# Patient Record
Sex: Male | Born: 1950 | Race: White | Hispanic: No | Marital: Married | Smoking: Former smoker
Health system: Southern US, Community
[De-identification: ages and names within clinical notes are randomized; demographics above are authoritative.]

## PROBLEM LIST (undated history)

## (undated) ENCOUNTER — Emergency Department (HOSPITAL_COMMUNITY): Discharge status: Absent without leave | Payer: Medicare Other

## (undated) DIAGNOSIS — J449 Chronic obstructive pulmonary disease, unspecified: Secondary | ICD-10-CM

## (undated) DIAGNOSIS — M1990 Unspecified osteoarthritis, unspecified site: Secondary | ICD-10-CM

## (undated) DIAGNOSIS — Z8673 Personal history of transient ischemic attack (TIA), and cerebral infarction without residual deficits: Secondary | ICD-10-CM

## (undated) DIAGNOSIS — C4490 Unspecified malignant neoplasm of skin, unspecified: Secondary | ICD-10-CM

## (undated) HISTORY — DX: Personal history of transient ischemic attack (TIA), and cerebral infarction without residual deficits: Z86.73

## (undated) HISTORY — DX: Chronic obstructive pulmonary disease, unspecified: J44.9

## (undated) HISTORY — DX: Unspecified osteoarthritis, unspecified site: M19.90

## (undated) HISTORY — DX: Unspecified malignant neoplasm of skin, unspecified: C44.90

## (undated) HISTORY — PX: KNEE SURGERY: SHX244

---

## 2014-10-05 DIAGNOSIS — R972 Elevated prostate specific antigen [PSA]: Secondary | ICD-10-CM | POA: Diagnosis not present

## 2014-10-05 DIAGNOSIS — N401 Enlarged prostate with lower urinary tract symptoms: Secondary | ICD-10-CM | POA: Diagnosis not present

## 2014-10-05 DIAGNOSIS — R361 Hematospermia: Secondary | ICD-10-CM | POA: Diagnosis not present

## 2014-10-10 DIAGNOSIS — R319 Hematuria, unspecified: Secondary | ICD-10-CM | POA: Diagnosis not present

## 2014-10-10 DIAGNOSIS — R361 Hematospermia: Secondary | ICD-10-CM | POA: Diagnosis not present

## 2014-10-10 DIAGNOSIS — N3090 Cystitis, unspecified without hematuria: Secondary | ICD-10-CM | POA: Diagnosis not present

## 2014-10-10 DIAGNOSIS — N400 Enlarged prostate without lower urinary tract symptoms: Secondary | ICD-10-CM | POA: Diagnosis not present

## 2014-10-10 DIAGNOSIS — N281 Cyst of kidney, acquired: Secondary | ICD-10-CM | POA: Diagnosis not present

## 2014-10-10 DIAGNOSIS — K7689 Other specified diseases of liver: Secondary | ICD-10-CM | POA: Diagnosis not present

## 2014-10-24 DIAGNOSIS — R361 Hematospermia: Secondary | ICD-10-CM | POA: Diagnosis not present

## 2014-10-24 DIAGNOSIS — R972 Elevated prostate specific antigen [PSA]: Secondary | ICD-10-CM | POA: Diagnosis not present

## 2014-11-14 DIAGNOSIS — Z85828 Personal history of other malignant neoplasm of skin: Secondary | ICD-10-CM | POA: Diagnosis not present

## 2014-11-14 DIAGNOSIS — M25561 Pain in right knee: Secondary | ICD-10-CM | POA: Diagnosis not present

## 2014-11-14 DIAGNOSIS — N63 Unspecified lump in breast: Secondary | ICD-10-CM | POA: Diagnosis not present

## 2014-11-15 DIAGNOSIS — M25561 Pain in right knee: Secondary | ICD-10-CM | POA: Diagnosis not present

## 2014-11-20 DIAGNOSIS — N411 Chronic prostatitis: Secondary | ICD-10-CM | POA: Diagnosis not present

## 2014-11-20 DIAGNOSIS — R972 Elevated prostate specific antigen [PSA]: Secondary | ICD-10-CM | POA: Diagnosis not present

## 2014-11-20 DIAGNOSIS — N401 Enlarged prostate with lower urinary tract symptoms: Secondary | ICD-10-CM | POA: Diagnosis not present

## 2014-11-21 DIAGNOSIS — M23241 Derangement of anterior horn of lateral meniscus due to old tear or injury, right knee: Secondary | ICD-10-CM | POA: Diagnosis not present

## 2014-11-21 DIAGNOSIS — S83241A Other tear of medial meniscus, current injury, right knee, initial encounter: Secondary | ICD-10-CM | POA: Diagnosis not present

## 2014-11-21 DIAGNOSIS — M25561 Pain in right knee: Secondary | ICD-10-CM | POA: Diagnosis not present

## 2014-11-21 DIAGNOSIS — M179 Osteoarthritis of knee, unspecified: Secondary | ICD-10-CM | POA: Diagnosis not present

## 2014-11-22 DIAGNOSIS — S83281A Other tear of lateral meniscus, current injury, right knee, initial encounter: Secondary | ICD-10-CM | POA: Diagnosis not present

## 2014-11-22 DIAGNOSIS — M179 Osteoarthritis of knee, unspecified: Secondary | ICD-10-CM | POA: Diagnosis not present

## 2014-11-29 DIAGNOSIS — N63 Unspecified lump in breast: Secondary | ICD-10-CM | POA: Diagnosis not present

## 2014-11-29 DIAGNOSIS — D171 Benign lipomatous neoplasm of skin and subcutaneous tissue of trunk: Secondary | ICD-10-CM | POA: Diagnosis not present

## 2014-11-29 DIAGNOSIS — R972 Elevated prostate specific antigen [PSA]: Secondary | ICD-10-CM | POA: Diagnosis not present

## 2014-11-29 DIAGNOSIS — D1779 Benign lipomatous neoplasm of other sites: Secondary | ICD-10-CM | POA: Diagnosis not present

## 2014-11-29 DIAGNOSIS — N401 Enlarged prostate with lower urinary tract symptoms: Secondary | ICD-10-CM | POA: Diagnosis not present

## 2014-12-11 DIAGNOSIS — M549 Dorsalgia, unspecified: Secondary | ICD-10-CM | POA: Diagnosis not present

## 2014-12-11 DIAGNOSIS — G894 Chronic pain syndrome: Secondary | ICD-10-CM | POA: Diagnosis not present

## 2014-12-11 DIAGNOSIS — M545 Low back pain: Secondary | ICD-10-CM | POA: Diagnosis not present

## 2014-12-11 DIAGNOSIS — G890 Central pain syndrome: Secondary | ICD-10-CM | POA: Diagnosis not present

## 2014-12-11 DIAGNOSIS — M79605 Pain in left leg: Secondary | ICD-10-CM | POA: Diagnosis not present

## 2014-12-11 DIAGNOSIS — G8929 Other chronic pain: Secondary | ICD-10-CM | POA: Diagnosis not present

## 2014-12-11 DIAGNOSIS — M79604 Pain in right leg: Secondary | ICD-10-CM | POA: Diagnosis not present

## 2014-12-11 DIAGNOSIS — M159 Polyosteoarthritis, unspecified: Secondary | ICD-10-CM | POA: Diagnosis not present

## 2014-12-11 DIAGNOSIS — M542 Cervicalgia: Secondary | ICD-10-CM | POA: Diagnosis not present

## 2014-12-11 DIAGNOSIS — M79602 Pain in left arm: Secondary | ICD-10-CM | POA: Diagnosis not present

## 2014-12-11 DIAGNOSIS — M79601 Pain in right arm: Secondary | ICD-10-CM | POA: Diagnosis not present

## 2014-12-11 DIAGNOSIS — M797 Fibromyalgia: Secondary | ICD-10-CM | POA: Diagnosis not present

## 2014-12-18 DIAGNOSIS — N3090 Cystitis, unspecified without hematuria: Secondary | ICD-10-CM | POA: Diagnosis not present

## 2014-12-24 DIAGNOSIS — N3090 Cystitis, unspecified without hematuria: Secondary | ICD-10-CM | POA: Diagnosis not present

## 2014-12-26 DIAGNOSIS — S83281A Other tear of lateral meniscus, current injury, right knee, initial encounter: Secondary | ICD-10-CM | POA: Diagnosis not present

## 2015-01-01 DIAGNOSIS — M25569 Pain in unspecified knee: Secondary | ICD-10-CM | POA: Diagnosis not present

## 2015-01-01 DIAGNOSIS — E782 Mixed hyperlipidemia: Secondary | ICD-10-CM | POA: Diagnosis not present

## 2015-01-01 DIAGNOSIS — J449 Chronic obstructive pulmonary disease, unspecified: Secondary | ICD-10-CM | POA: Diagnosis not present

## 2015-01-01 DIAGNOSIS — I10 Essential (primary) hypertension: Secondary | ICD-10-CM | POA: Diagnosis not present

## 2015-01-07 DIAGNOSIS — Z0389 Encounter for observation for other suspected diseases and conditions ruled out: Secondary | ICD-10-CM | POA: Diagnosis not present

## 2015-01-07 DIAGNOSIS — Z01810 Encounter for preprocedural cardiovascular examination: Secondary | ICD-10-CM | POA: Diagnosis not present

## 2015-01-08 DIAGNOSIS — M23331 Other meniscus derangements, other medial meniscus, right knee: Secondary | ICD-10-CM | POA: Diagnosis not present

## 2015-01-08 DIAGNOSIS — G4733 Obstructive sleep apnea (adult) (pediatric): Secondary | ICD-10-CM | POA: Diagnosis not present

## 2015-01-08 DIAGNOSIS — E669 Obesity, unspecified: Secondary | ICD-10-CM | POA: Diagnosis not present

## 2015-01-08 DIAGNOSIS — S83241A Other tear of medial meniscus, current injury, right knee, initial encounter: Secondary | ICD-10-CM | POA: Diagnosis not present

## 2015-01-08 DIAGNOSIS — S83281A Other tear of lateral meniscus, current injury, right knee, initial encounter: Secondary | ICD-10-CM | POA: Diagnosis not present

## 2015-01-08 DIAGNOSIS — M23341 Other meniscus derangements, anterior horn of lateral meniscus, right knee: Secondary | ICD-10-CM | POA: Diagnosis not present

## 2015-01-08 DIAGNOSIS — M1711 Unilateral primary osteoarthritis, right knee: Secondary | ICD-10-CM | POA: Diagnosis not present

## 2015-01-09 DIAGNOSIS — R0602 Shortness of breath: Secondary | ICD-10-CM | POA: Diagnosis not present

## 2015-01-09 DIAGNOSIS — G4733 Obstructive sleep apnea (adult) (pediatric): Secondary | ICD-10-CM | POA: Diagnosis not present

## 2015-01-09 DIAGNOSIS — K219 Gastro-esophageal reflux disease without esophagitis: Secondary | ICD-10-CM | POA: Diagnosis not present

## 2015-01-09 DIAGNOSIS — R079 Chest pain, unspecified: Secondary | ICD-10-CM | POA: Diagnosis not present

## 2015-01-09 DIAGNOSIS — I10 Essential (primary) hypertension: Secondary | ICD-10-CM | POA: Diagnosis not present

## 2015-01-09 DIAGNOSIS — Z8546 Personal history of malignant neoplasm of prostate: Secondary | ICD-10-CM | POA: Diagnosis not present

## 2015-01-09 DIAGNOSIS — I498 Other specified cardiac arrhythmias: Secondary | ICD-10-CM | POA: Diagnosis not present

## 2015-01-09 DIAGNOSIS — I69398 Other sequelae of cerebral infarction: Secondary | ICD-10-CM | POA: Diagnosis not present

## 2015-01-09 DIAGNOSIS — M1990 Unspecified osteoarthritis, unspecified site: Secondary | ICD-10-CM | POA: Diagnosis not present

## 2015-01-09 DIAGNOSIS — Z87891 Personal history of nicotine dependence: Secondary | ICD-10-CM | POA: Diagnosis not present

## 2015-01-09 DIAGNOSIS — R0902 Hypoxemia: Secondary | ICD-10-CM | POA: Diagnosis not present

## 2015-01-09 DIAGNOSIS — I509 Heart failure, unspecified: Secondary | ICD-10-CM | POA: Diagnosis not present

## 2015-01-09 DIAGNOSIS — E119 Type 2 diabetes mellitus without complications: Secondary | ICD-10-CM | POA: Diagnosis not present

## 2015-01-09 DIAGNOSIS — J9811 Atelectasis: Secondary | ICD-10-CM | POA: Diagnosis not present

## 2015-01-09 DIAGNOSIS — Z79899 Other long term (current) drug therapy: Secondary | ICD-10-CM | POA: Diagnosis not present

## 2015-01-09 DIAGNOSIS — J811 Chronic pulmonary edema: Secondary | ICD-10-CM | POA: Diagnosis not present

## 2015-01-09 DIAGNOSIS — J441 Chronic obstructive pulmonary disease with (acute) exacerbation: Secondary | ICD-10-CM | POA: Diagnosis not present

## 2015-01-09 DIAGNOSIS — I249 Acute ischemic heart disease, unspecified: Secondary | ICD-10-CM | POA: Diagnosis not present

## 2015-01-09 DIAGNOSIS — Z9889 Other specified postprocedural states: Secondary | ICD-10-CM | POA: Diagnosis not present

## 2015-01-14 DIAGNOSIS — M25561 Pain in right knee: Secondary | ICD-10-CM | POA: Diagnosis not present

## 2015-01-14 DIAGNOSIS — R262 Difficulty in walking, not elsewhere classified: Secondary | ICD-10-CM | POA: Diagnosis not present

## 2015-01-14 DIAGNOSIS — S83281D Other tear of lateral meniscus, current injury, right knee, subsequent encounter: Secondary | ICD-10-CM | POA: Diagnosis not present

## 2015-01-15 DIAGNOSIS — Z6835 Body mass index (BMI) 35.0-35.9, adult: Secondary | ICD-10-CM | POA: Diagnosis not present

## 2015-01-15 DIAGNOSIS — R0989 Other specified symptoms and signs involving the circulatory and respiratory systems: Secondary | ICD-10-CM | POA: Diagnosis not present

## 2015-01-15 DIAGNOSIS — Z9889 Other specified postprocedural states: Secondary | ICD-10-CM | POA: Diagnosis not present

## 2015-01-31 DIAGNOSIS — J439 Emphysema, unspecified: Secondary | ICD-10-CM | POA: Diagnosis not present

## 2015-02-27 DIAGNOSIS — G890 Central pain syndrome: Secondary | ICD-10-CM | POA: Diagnosis not present

## 2015-02-27 DIAGNOSIS — G894 Chronic pain syndrome: Secondary | ICD-10-CM | POA: Diagnosis not present

## 2015-02-27 DIAGNOSIS — M159 Polyosteoarthritis, unspecified: Secondary | ICD-10-CM | POA: Diagnosis not present

## 2015-02-27 DIAGNOSIS — M1990 Unspecified osteoarthritis, unspecified site: Secondary | ICD-10-CM | POA: Diagnosis not present

## 2015-02-27 DIAGNOSIS — M797 Fibromyalgia: Secondary | ICD-10-CM | POA: Diagnosis not present

## 2015-03-03 DIAGNOSIS — J439 Emphysema, unspecified: Secondary | ICD-10-CM | POA: Diagnosis not present

## 2015-03-26 DIAGNOSIS — G4733 Obstructive sleep apnea (adult) (pediatric): Secondary | ICD-10-CM | POA: Diagnosis not present

## 2015-03-26 DIAGNOSIS — G44229 Chronic tension-type headache, not intractable: Secondary | ICD-10-CM | POA: Diagnosis not present

## 2015-03-26 DIAGNOSIS — G2581 Restless legs syndrome: Secondary | ICD-10-CM | POA: Diagnosis not present

## 2015-03-26 DIAGNOSIS — G250 Essential tremor: Secondary | ICD-10-CM | POA: Diagnosis not present

## 2015-04-02 DIAGNOSIS — J439 Emphysema, unspecified: Secondary | ICD-10-CM | POA: Diagnosis not present

## 2015-04-09 DIAGNOSIS — Z1389 Encounter for screening for other disorder: Secondary | ICD-10-CM | POA: Diagnosis not present

## 2015-04-09 DIAGNOSIS — Z0000 Encounter for general adult medical examination without abnormal findings: Secondary | ICD-10-CM | POA: Diagnosis not present

## 2015-04-09 DIAGNOSIS — Z139 Encounter for screening, unspecified: Secondary | ICD-10-CM | POA: Diagnosis not present

## 2015-04-18 DIAGNOSIS — J441 Chronic obstructive pulmonary disease with (acute) exacerbation: Secondary | ICD-10-CM | POA: Diagnosis not present

## 2015-04-18 DIAGNOSIS — Z6835 Body mass index (BMI) 35.0-35.9, adult: Secondary | ICD-10-CM | POA: Diagnosis not present

## 2015-05-09 DIAGNOSIS — E782 Mixed hyperlipidemia: Secondary | ICD-10-CM | POA: Diagnosis not present

## 2015-05-09 DIAGNOSIS — R7302 Impaired glucose tolerance (oral): Secondary | ICD-10-CM | POA: Diagnosis not present

## 2015-05-09 DIAGNOSIS — I10 Essential (primary) hypertension: Secondary | ICD-10-CM | POA: Diagnosis not present

## 2015-05-16 DIAGNOSIS — R7309 Other abnormal glucose: Secondary | ICD-10-CM | POA: Diagnosis not present

## 2015-05-16 DIAGNOSIS — I129 Hypertensive chronic kidney disease with stage 1 through stage 4 chronic kidney disease, or unspecified chronic kidney disease: Secondary | ICD-10-CM | POA: Diagnosis not present

## 2015-05-16 DIAGNOSIS — E782 Mixed hyperlipidemia: Secondary | ICD-10-CM | POA: Diagnosis not present

## 2015-05-16 DIAGNOSIS — N182 Chronic kidney disease, stage 2 (mild): Secondary | ICD-10-CM | POA: Diagnosis not present

## 2015-05-21 DIAGNOSIS — R635 Abnormal weight gain: Secondary | ICD-10-CM | POA: Diagnosis not present

## 2015-05-21 DIAGNOSIS — R609 Edema, unspecified: Secondary | ICD-10-CM | POA: Diagnosis not present

## 2015-05-21 DIAGNOSIS — R0602 Shortness of breath: Secondary | ICD-10-CM | POA: Diagnosis not present

## 2015-05-21 DIAGNOSIS — R05 Cough: Secondary | ICD-10-CM | POA: Diagnosis not present

## 2015-05-23 DIAGNOSIS — J449 Chronic obstructive pulmonary disease, unspecified: Secondary | ICD-10-CM | POA: Diagnosis not present

## 2015-06-11 DIAGNOSIS — Z6835 Body mass index (BMI) 35.0-35.9, adult: Secondary | ICD-10-CM | POA: Diagnosis not present

## 2015-06-11 DIAGNOSIS — J439 Emphysema, unspecified: Secondary | ICD-10-CM | POA: Diagnosis not present

## 2015-06-11 DIAGNOSIS — R739 Hyperglycemia, unspecified: Secondary | ICD-10-CM | POA: Diagnosis not present

## 2015-06-11 DIAGNOSIS — R05 Cough: Secondary | ICD-10-CM | POA: Diagnosis not present

## 2015-06-11 DIAGNOSIS — R0602 Shortness of breath: Secondary | ICD-10-CM | POA: Diagnosis not present

## 2015-06-12 DIAGNOSIS — M1990 Unspecified osteoarthritis, unspecified site: Secondary | ICD-10-CM | POA: Diagnosis not present

## 2015-06-12 DIAGNOSIS — J439 Emphysema, unspecified: Secondary | ICD-10-CM | POA: Diagnosis not present

## 2015-06-12 DIAGNOSIS — G894 Chronic pain syndrome: Secondary | ICD-10-CM | POA: Diagnosis not present

## 2015-06-12 DIAGNOSIS — F4542 Pain disorder with related psychological factors: Secondary | ICD-10-CM | POA: Diagnosis not present

## 2015-06-12 DIAGNOSIS — G609 Hereditary and idiopathic neuropathy, unspecified: Secondary | ICD-10-CM | POA: Diagnosis not present

## 2015-06-12 DIAGNOSIS — G890 Central pain syndrome: Secondary | ICD-10-CM | POA: Diagnosis not present

## 2015-06-12 DIAGNOSIS — M797 Fibromyalgia: Secondary | ICD-10-CM | POA: Diagnosis not present

## 2015-06-17 DIAGNOSIS — I361 Nonrheumatic tricuspid (valve) insufficiency: Secondary | ICD-10-CM | POA: Diagnosis not present

## 2015-06-17 DIAGNOSIS — R0602 Shortness of breath: Secondary | ICD-10-CM | POA: Diagnosis not present

## 2015-06-17 DIAGNOSIS — R609 Edema, unspecified: Secondary | ICD-10-CM | POA: Diagnosis not present

## 2015-06-25 DIAGNOSIS — R0601 Orthopnea: Secondary | ICD-10-CM | POA: Diagnosis not present

## 2015-06-25 DIAGNOSIS — R05 Cough: Secondary | ICD-10-CM | POA: Diagnosis not present

## 2015-06-25 DIAGNOSIS — Z68.41 Body mass index (BMI) pediatric, 5th percentile to less than 85th percentile for age: Secondary | ICD-10-CM | POA: Diagnosis not present

## 2015-06-25 DIAGNOSIS — I10 Essential (primary) hypertension: Secondary | ICD-10-CM | POA: Diagnosis not present

## 2015-07-12 DIAGNOSIS — J439 Emphysema, unspecified: Secondary | ICD-10-CM | POA: Diagnosis not present

## 2015-07-23 DIAGNOSIS — I10 Essential (primary) hypertension: Secondary | ICD-10-CM | POA: Diagnosis not present

## 2015-07-23 DIAGNOSIS — E1159 Type 2 diabetes mellitus with other circulatory complications: Secondary | ICD-10-CM | POA: Diagnosis not present

## 2015-07-23 DIAGNOSIS — J42 Unspecified chronic bronchitis: Secondary | ICD-10-CM | POA: Diagnosis not present

## 2015-07-23 DIAGNOSIS — J441 Chronic obstructive pulmonary disease with (acute) exacerbation: Secondary | ICD-10-CM | POA: Diagnosis not present

## 2015-07-30 DIAGNOSIS — G4733 Obstructive sleep apnea (adult) (pediatric): Secondary | ICD-10-CM | POA: Diagnosis not present

## 2015-07-30 DIAGNOSIS — I10 Essential (primary) hypertension: Secondary | ICD-10-CM | POA: Diagnosis not present

## 2015-07-30 DIAGNOSIS — R0600 Dyspnea, unspecified: Secondary | ICD-10-CM | POA: Diagnosis not present

## 2015-07-30 DIAGNOSIS — E785 Hyperlipidemia, unspecified: Secondary | ICD-10-CM | POA: Diagnosis not present

## 2015-07-30 DIAGNOSIS — R0602 Shortness of breath: Secondary | ICD-10-CM | POA: Diagnosis not present

## 2015-07-31 DIAGNOSIS — I739 Peripheral vascular disease, unspecified: Secondary | ICD-10-CM | POA: Diagnosis not present

## 2015-08-09 ENCOUNTER — Other Ambulatory Visit: Payer: Self-pay | Admitting: *Deleted

## 2015-08-09 ENCOUNTER — Encounter: Payer: Self-pay | Admitting: *Deleted

## 2015-08-09 ENCOUNTER — Ambulatory Visit (INDEPENDENT_AMBULATORY_CARE_PROVIDER_SITE_OTHER)
Admission: RE | Admit: 2015-08-09 | Discharge: 2015-08-09 | Disposition: A | Discharge status: Home / Self Care | Payer: Medicare Other | Source: Ambulatory Visit | Attending: Internal Medicine | Admitting: Internal Medicine

## 2015-08-09 ENCOUNTER — Other Ambulatory Visit (INDEPENDENT_AMBULATORY_CARE_PROVIDER_SITE_OTHER): Payer: Medicare Other

## 2015-08-09 ENCOUNTER — Ambulatory Visit (INDEPENDENT_AMBULATORY_CARE_PROVIDER_SITE_OTHER): Payer: Medicare Other | Admitting: Internal Medicine

## 2015-08-09 VITALS — BP 132/72 | HR 83 | Ht 74.0 in | Wt 268.6 lb

## 2015-08-09 DIAGNOSIS — R058 Other specified cough: Secondary | ICD-10-CM

## 2015-08-09 DIAGNOSIS — R059 Cough, unspecified: Secondary | ICD-10-CM

## 2015-08-09 DIAGNOSIS — R0600 Dyspnea, unspecified: Secondary | ICD-10-CM

## 2015-08-09 DIAGNOSIS — R0602 Shortness of breath: Secondary | ICD-10-CM | POA: Diagnosis not present

## 2015-08-09 DIAGNOSIS — I2699 Other pulmonary embolism without acute cor pulmonale: Secondary | ICD-10-CM | POA: Diagnosis not present

## 2015-08-09 DIAGNOSIS — J449 Chronic obstructive pulmonary disease, unspecified: Secondary | ICD-10-CM

## 2015-08-09 DIAGNOSIS — G4734 Idiopathic sleep related nonobstructive alveolar hypoventilation: Secondary | ICD-10-CM

## 2015-08-09 DIAGNOSIS — R05 Cough: Secondary | ICD-10-CM | POA: Diagnosis not present

## 2015-08-09 LAB — BASIC METABOLIC PANEL
BUN: 21 mg/dL (ref 6–23)
CALCIUM: 9.8 mg/dL (ref 8.4–10.5)
CO2: 32 mEq/L (ref 19–32)
Chloride: 103 mEq/L (ref 96–112)
Creatinine, Ser: 1.33 mg/dL (ref 0.40–1.50)
GFR: 57.23 mL/min — AB (ref >60.00)
GLUCOSE: 122 mg/dL — AB (ref 70–99)
POTASSIUM: 3.7 meq/L (ref 3.5–5.1)
SODIUM: 141 meq/L (ref 135–145)

## 2015-08-09 LAB — TSH: TSH: 1.1 u[IU]/mL (ref 0.35–4.50)

## 2015-08-09 LAB — CBC WITH DIFFERENTIAL/PLATELET
Basophils Absolute: 0 K/uL (ref 0.0–0.1)
Basophils Relative: 0.4 % (ref 0.0–3.0)
Eosinophils Absolute: 0.5 K/uL (ref 0.0–0.7)
Eosinophils Relative: 6.9 % — ABNORMAL HIGH (ref 0.0–5.0)
HCT: 40.2 % (ref 39.0–52.0)
Hemoglobin: 13.4 g/dL (ref 13.0–17.0)
Lymphocytes Relative: 25 % (ref 12.0–46.0)
Lymphs Abs: 1.8 K/uL (ref 0.7–4.0)
MCHC: 33.3 g/dL (ref 30.0–36.0)
MCV: 87.7 fl (ref 78.0–100.0)
Monocytes Absolute: 0.4 K/uL (ref 0.1–1.0)
Monocytes Relative: 6 % (ref 3.0–12.0)
Neutro Abs: 4.4 K/uL (ref 1.4–7.7)
Neutrophils Relative %: 61.7 % (ref 43.0–77.0)
Platelets: 241 K/uL (ref 150.0–400.0)
RBC: 4.59 Mil/uL (ref 4.22–5.81)
RDW: 13.1 % (ref 11.5–15.5)
WBC: 7.1 K/uL (ref 4.0–10.5)

## 2015-08-09 LAB — BRAIN NATRIURETIC PEPTIDE: PRO B NATRI PEPTIDE: 29 pg/mL (ref 0.0–100.0)

## 2015-08-09 MED ORDER — PREDNISONE 10 MG PO TABS: ORAL_TABLET | ORAL | Status: DC

## 2015-08-09 MED ORDER — FLUTTER DEVI
Status: DC
Start: 2015-08-09 — End: 2015-08-09

## 2015-08-09 MED ORDER — AMOXICILLIN-POT CLAVULANATE 875-125 MG PO TABS: 1.0000 | ORAL_TABLET | Freq: Two times a day (BID) | ORAL | Status: DC

## 2015-08-09 MED ORDER — FLUTTER DEVI: Status: DC

## 2015-08-09 NOTE — Patient Instructions (Addendum)
Continue dexilant Take 30-60 min before first meal of the day and zantac at bedtime   mucinex 1200 mg every 12 hours with flutter valve as much as can   GERD (REFLUX)  is an extremely common cause of respiratory symptoms just like yours , many times with no obvious heartburn at all.    It can be treated with medication, but also with lifestyle changes including elevation of the head of your bed (ideally with 6 inch  bed blocks),  Smoking cessation, avoidance of late meals, excessive alcohol, and avoid fatty foods, chocolate, peppermint, colas, red wine, and acidic juices such as orange juice.  NO MINT OR MENTHOL PRODUCTS SO NO COUGH DROPS  USE SUGARLESS CANDY INSTEAD (Jolley ranchers or Stover's or Life Savers) or even ice chips will also do - the key is to swallow to prevent all throat clearing. NO OIL BASED VITAMINS - use powdered substitutes.  stiolto 2 pffs each am   Augmentin 875 mg take one pill twice daily  X 10 days - take at breakfast and supper with large glass of water.  It would help reduce the usual side effects (diarrhea and yeast infections) if you ate cultured yogurt at lunch.   If not better >>> Prednisone 10 mg take  4 each am x 2 days,   2 each am x 2 days,  1 each am x 2 days and stop    Please remember to go to the lab and x-ray department downstairs for your tests - we will call you with the results when they are available.     Please schedule a follow up office visit in 2 weeks, sooner if needed  - spirometry at next ov plus allergy profile

## 2015-08-09 NOTE — Progress Notes (Signed)
Subjective:     Patient ID: Luis Bryan, male   DOB: June 17, 1951,     MRN: [id number]  HPI  20 yowm quit smoking 1980 started having breathing problems around 2013 at wt around 225 rx with various inhalers/ and freq prednisone then much worse x early summer 2016 and changed to breo and incruse late oct 2016 by Dr Sheral Apley who referred him to pulmonary clinic 08/09/2015    08/09/2015 1st [facility] office visit/ Rakan Soffer   Chief Complaint  Patient presents with  . Pulmonary Consult    Referred by Dr. Nyra Capes. Pt c/o constant SOB that is worse exertion and when laying down, occasional wet cough with green/brown mucus and some wheeze. Pt also has frequent episodes of dizziness.   last time could lie down was 6 m prior to OV  And sleeps semi - recumbent with severe noct wheeze and loss of voice  And hacking cough assoc with nasal congestion, all occurring over the same time frame. Just started 02 six months ago and does not have portable system but would like one as can't get across a parking lot s sob or even walk at grocery stores any more- some better p combivent using up to 4 x daily Has remote asbestos exp  No obvious  patterns in day to day or daytime variabilty or assoc chronic cough or cp or chest tightness, subjective wheeze overt sinus or hb symptoms. No unusual exp hx or h/o childhood pna/ asthma or knowledge of premature birth.  Also denies any obvious fluctuation of symptoms with weather or environmental changes or other aggravating or alleviating factors except as outlined above   Current Medications, Allergies, Complete Past Medical History, Past Surgical History, Family History, and Social History were reviewed in [employer] record.              Review of Systems  Constitutional: Negative for fever and unexpected weight change.  HENT: Positive for congestion, postnasal drip and rhinorrhea. Negative for dental problem, ear pain, nosebleeds,  sinus pressure, sneezing, sore throat and trouble swallowing.   Eyes: Negative.  Negative for redness and itching.  Respiratory: Positive for cough, shortness of breath and wheezing. Negative for chest tightness.   Cardiovascular: Positive for palpitations and leg swelling.  Gastrointestinal: Negative.  Negative for nausea and vomiting.  Endocrine: Negative.   Genitourinary: Negative.  Negative for dysuria.  Musculoskeletal: Negative.  Negative for joint swelling.  Skin: Negative.  Negative for rash.  Allergic/Immunologic: Negative.   Neurological: Positive for headaches.  Hematological: Bruises/bleeds easily.  Psychiatric/Behavioral: Negative.  Negative for dysphoric mood. The patient is not nervous/anxious.        Objective:   Physical Exam   amb obese wm nad at rest  Wt Readings from Last 3 Encounters:  08/09/15 268 lb 9.6 oz (121.836 kg)    Vital signs reviewed   HEENT: nl dentition, turbinates, and oropharynx. Nl external ear canals without cough reflex   NECK :  without JVD/Nodes/TM/ nl carotid upstrokes bilaterally   LUNGS: no acc muscle use, clear to A and P bilaterally without cough on insp or exp maneuvers   CV:  RRR  no s3 or murmur or increase in P2, no edema   ABD:  Massively obese but  soft and nontender with nl excursion in the supine position. No bruits or organomegaly, bowel sounds nl  MS:  warm without deformities, calf tenderness, cyanosis or clubbing  SKIN: warm and dry without lesions  NEURO:  alert, approp, no deficits    CXR PA and Lateral:   08/09/2015 :    I personally reviewed images and agree with radiology impression as follows:   No evidence of lobar pneumonia or edema, however, the chest x-ray is suggestive of hilar lymphadenopathy  Labs ordered/ reviewed:   Lab 08/09/15 1617  NA 141  K 3.7  CL 103  CO2 32  BUN 21  CREATININE 1.33  GLUCOSE 122*     Lab 08/09/15 1617  HGB 13.4  HCT 40.2  WBC 7.1  PLT 241.0  Eos 0.5     Lab Results  Component Value Date   TSH 1.10 08/09/2015     Lab Results  Component Value Date   PROBNP 29.0 08/09/2015            Assessment:

## 2015-08-10 DIAGNOSIS — J9611 Chronic respiratory failure with hypoxia: Secondary | ICD-10-CM | POA: Insufficient documentation

## 2015-08-10 DIAGNOSIS — R05 Cough: Secondary | ICD-10-CM | POA: Insufficient documentation

## 2015-08-10 DIAGNOSIS — J449 Chronic obstructive pulmonary disease, unspecified: Secondary | ICD-10-CM | POA: Insufficient documentation

## 2015-08-10 DIAGNOSIS — J9612 Chronic respiratory failure with hypercapnia: Secondary | ICD-10-CM

## 2015-08-10 DIAGNOSIS — R058 Other specified cough: Secondary | ICD-10-CM | POA: Insufficient documentation

## 2015-08-10 NOTE — Assessment & Plan Note (Signed)
rx per Dr Nyra Capes with noct 02 2lpm  - HC03  32 08/09/2015 c/w mild hypercarbia  - did not desat walking x 2 laps 08/09/2015 RA

## 2015-08-10 NOTE — Assessment & Plan Note (Signed)
Complicated by hbp/ hyperlipidemia, ? Incipient OHS (HC03 32 on 08/09/2015 )  Body mass index is 34.47 kg/(m^2).  Lab Results  Component Value Date   TSH 1.10 08/09/2015     Contributing also  to gerd tendency/ doe/reviewed the need and the process to achieve and maintain neg calorie balance > defer f/u primary care including intermittently monitoring thyroid status

## 2015-08-10 NOTE — Assessment & Plan Note (Signed)
Symptoms are markedly disproportionate to objective findings and not clear this is all lung problem but pt does appear to have difficult airway management issues.  DDX of  difficult airways management all start with A and  include Adherence, Ace Inhibitors, Acid Reflux, Active Sinus Disease, Alpha 1 Antitripsin deficiency, Anxiety masquerading as Airways dz,  ABPA,  allergy(esp in young), Aspiration (esp in elderly), Adverse effects of meds,  Active smokers, A bunch of PE's (a small clot burden can't cause this syndrome unless there is already severe underlying pulm or vascular dz with poor reserve) plus two Bs  = Bronchiectasis and Beta blocker use..and one C= CHF   Adherence is always the initial "prime suspect" and is a multilayered concern that requires a "trust but verify" approach in every patient - starting with knowing how to use medications, especially inhalers, correctly, keeping up with refills and understanding the fundamental difference between maintenance and prns vs those medications only taken for a very short course and then stopped and not refilled.  .-The proper method of use, as well as anticipated side effects, of a metered-dose inhaler are discussed and demonstrated to the patient. Improved effectiveness after extensive coaching during this visit to a level of approximately  90% so try stiolto x 2 weeks then regroup  ? Active sinus dz > Augmentin 875 mg take one pill twice daily  X 10 days - take at breakfast and supper with large glass of water.  It would help reduce the usual side effects (diarrhea and yeast infections) if you ate cultured yogurt at lunch.   ? Allergies/ ? Asthmatic component > Prednisone 10 mg take  4 each am x 2 days,   2 each am x 2 days,  1 each am x 2 days and stop    ? chf > excluded with bnp << 100 though could have mild cor pulmonale with prominent PA's on cxr   I had an extended discussion with the patient and wife  reviewing all relevant studies  completed to date and  lasting 35 minutes of a 60 minute visit    Each maintenance medication was reviewed in detail including most importantly the difference between maintenance and prns and under what circumstances the prns are to be triggered using an action plan format that is not reflected in the computer generated alphabetically organized AVS.    Please see instructions for details which were reviewed in writing and the patient given a copy highlighting the part that I personally wrote and discussed at today's ov.

## 2015-08-10 NOTE — Assessment & Plan Note (Signed)
08/09/2015   Walked RA  2 laps @ 185 ft each stopped due to  Cough and sob/ nl pace, no desat  I was more impressed that cough is stopping him more than ventilatory impairment so will address this first pending return for pfts

## 2015-08-10 NOTE — Assessment & Plan Note (Addendum)
The most common causes of chronic cough in immunocompetent adults include the following: upper airway cough syndrome (UACS), previously referred to as postnasal drip syndrome (PNDS), which is caused by variety of rhinosinus conditions; (2) asthma; (3) GERD; (4) chronic bronchitis from cigarette smoking or other inhaled environmental irritants; (5) nonasthmatic eosinophilic bronchitis; and (6) bronchiectasis.   These conditions, singly or in combination, have accounted for up to 94% of the causes of chronic cough in prospective studies.   Other conditions have constituted no >6% of the causes in prospective studies These have included bronchogenic carcinoma, chronic interstitial pneumonia, sarcoidosis, left ventricular failure, ACEI-induced cough, and aspiration from a condition associated with pharyngeal dysfunction.    Chronic cough is often simultaneously caused by more than one condition. A single cause has been found from 38 to 82% of the time, multiple causes from 18 to 62%. Multiply caused cough has been the result of three diseases up to 42% of the time.       Based on hx and exam, this is most likely:  Classic Upper airway cough syndrome, so named because it's frequently impossible to sort out how much is  CR/sinusitis with freq throat clearing (which can be related to primary GERD)   vs  causing  secondary (" extra esophageal")  GERD from wide swings in gastric pressure that occur with throat clearing, often  promoting self use of mint and menthol lozenges that reduce the lower esophageal sphincter tone and exacerbate the problem further in a cyclical fashion.   These are the same pts (now being labeled as having "irritable larynx syndrome" by some cough centers) who not infrequently have a history of having failed to tolerate ace inhibitors,  dry powder inhalers (like breo and incruse) or biphosphonates or report having atypical reflux symptoms that don't respond to standard doses of PPI , and  are easily confused as having aecopd or asthma flares by even experienced allergists/ pulmonologists.   The first step is to continue to maximize acid suppression and eliminate cyclical cough with flutter valve/ rx ?  sinus dz if present as well as stop  all dpi's then regroup in 2 weeks with sinus CT and full allergy profile next  if still coughing but this is not a typical CB pattern cough.

## 2015-08-12 DIAGNOSIS — J439 Emphysema, unspecified: Secondary | ICD-10-CM | POA: Diagnosis not present

## 2015-08-13 NOTE — Progress Notes (Signed)
Quick Note:  Contacted pt with results per MW Pt expressed understanding, nothing further needed ______ 

## 2015-09-02 ENCOUNTER — Other Ambulatory Visit: Payer: Medicare Other

## 2015-09-02 ENCOUNTER — Ambulatory Visit (INDEPENDENT_AMBULATORY_CARE_PROVIDER_SITE_OTHER): Payer: Medicare Other | Admitting: Internal Medicine

## 2015-09-02 ENCOUNTER — Encounter: Payer: Self-pay | Admitting: Internal Medicine

## 2015-09-02 VITALS — BP 122/72 | HR 79 | Ht 74.0 in | Wt 269.4 lb

## 2015-09-02 DIAGNOSIS — R058 Other specified cough: Secondary | ICD-10-CM

## 2015-09-02 DIAGNOSIS — J9611 Chronic respiratory failure with hypoxia: Secondary | ICD-10-CM

## 2015-09-02 DIAGNOSIS — J9612 Chronic respiratory failure with hypercapnia: Secondary | ICD-10-CM

## 2015-09-02 DIAGNOSIS — R05 Cough: Secondary | ICD-10-CM

## 2015-09-02 DIAGNOSIS — J449 Chronic obstructive pulmonary disease, unspecified: Secondary | ICD-10-CM

## 2015-09-02 DIAGNOSIS — R0600 Dyspnea, unspecified: Secondary | ICD-10-CM

## 2015-09-02 LAB — NITRIC OXIDE: Nitric Oxide: 88

## 2015-09-02 MED ORDER — TRAMADOL HCL 50 MG PO TABS: ORAL_TABLET | ORAL | Status: DC

## 2015-09-02 NOTE — Patient Instructions (Addendum)
Please see patient coordinator before you leave today  to schedule sinus and chest CT next and I will call you with results   Take delsym two tsp every 12 hours and supplement if needed with  tramadol 50 mg up to 2 every 4 hours to suppress the urge to cough. Swallowing water or using ice chips/non mint and menthol containing candies (such as lifesavers or sugarless jolly ranchers) are also effective.  You should rest your voice and avoid activities that you know make you cough.  Once you have eliminated the cough for 3 straight days try reducing the tramadol first,  then the delsym as tolerated.    Please schedule a follow up office visit in 2 weeks, sooner if needed

## 2015-09-02 NOTE — Progress Notes (Signed)
Subjective:     Patient ID: Luis Bryan, male   DOB: 1951-02-05,     MRN: [id number]    Brief patient profile:  61 yowm quit smoking 1980 started having breathing problems around 2013 at wt around 225 rx with various inhalers/ and freq prednisone then much worse x early summer 2016 and changed to breo and incruse late oct 2016 by Dr Sheral Apley who referred him to pulmonary clinic 08/09/2015  With ? Copd but had completel nl spirometry in office 09/02/2015 while symptomatic    History of Present Illness  08/09/2015 1st [facility] office visit/ [name]   Chief Complaint  Patient presents with  . Pulmonary Consult    Referred by Dr. Nyra Capes. Pt c/o constant SOB that is worse exertion and when laying down, occasional wet cough with green/brown mucus and some wheeze. Pt also has frequent episodes of dizziness.   last time could lie down was 6 m prior to OV  And sleeps semi - recumbent with severe noct wheeze and loss of voice  And hacking cough assoc with nasal congestion, all occurring over the same time frame. Just started 02 six months ago and does not have portable system but would like one as can't get across a parking lot s sob or even walk at grocery stores any more- some better p combivent using up to 4 x daily Has remote asbestos exp rec Continue dexilant Take 30-60 min before first meal of the day and zantac at bedtime  mucinex 1200 mg every 12 hours with flutter valve as much as can  GERD diet   stiolto 2 pffs each am  Augmentin 875 mg take one pill twice daily  X 10 days - take at breakfast and supper with large glass of water.  It would help reduce the usual side effects (diarrhea and yeast infections) if you ate cultured yogurt at lunch.  If not better >>> Prednisone 10 mg take  4 each am x 2 days,   2 each am x 2 days,  1 each am x 2 days and stop        09/02/2015  f/u ov/Gerlean Cid re: cough ? etiology Chief Complaint  Patient presents with  . Follow-up    Pt c/o continued  cough with brown mucus, wheeze and SOB. Pt states that he improved while on abx/pred but that once medications stopped his symptoms returned. Pt also states that flutter valve has not helped and "makes his chest hurt".   quite a bit better on abx / prednisone/ worse on flutter/ worse on stiolto so d/'d it.   No obvious day to day or daytime variability or assoc chest tightness, subjective wheeze or overt sinus or hb symptoms. No unusual exp hx or h/o childhood pna/ asthma or knowledge of premature birth.  Sleeping ok without nocturnal  or early am exacerbation  of respiratory  c/o's or need for noct saba. Also denies any obvious fluctuation of symptoms with weather or environmental changes or other aggravating or alleviating factors except as outlined above   Current Medications, Allergies, Complete Past Medical History, Past Surgical History, Family History, and Social History were reviewed in [employer] record.  ROS  The following are not active complaints unless bolded sore throat, dysphagia, dental problems, itching, sneezing,  nasal congestion or excess/ purulent secretions, ear ache,   fever, chills, sweats, unintended wt loss, classically pleuritic or exertional cp, hemoptysis,  orthopnea pnd or leg swelling, presyncope, palpitations, abdominal  pain, anorexia, nausea, vomiting, diarrhea  or change in bowel or bladder habits, change in stools or urine, dysuria,hematuria,  rash, arthralgias, visual complaints, headache, numbness, weakness or ataxia or problems with walking or coordination,  change in mood/affect or memory.                Objective:   Physical Exam   amb hoarse obese wm nad at rest/ pseudowheeze only / coughing fits noted esp with walking   Wt Readings from Last 3 Encounters:  09/02/15 269 lb 6.4 oz (122.199 kg)  08/09/15 268 lb 9.6 oz (121.836 kg)    Vital signs reviewed    HEENT: nl dentition, turbinates, and oropharynx. Nl external ear  canals without cough reflex   NECK :  without JVD/Nodes/TM/ nl carotid upstrokes bilaterally   LUNGS: no acc muscle use, clear to A and P bilaterally without cough on insp or exp maneuvers   CV:  RRR  no s3 or murmur or increase in P2, no edema   ABD:  Massively obese but  soft and nontender with nl excursion in the supine position. No bruits or organomegaly, bowel sounds nl  MS:  warm without deformities, calf tenderness, cyanosis or clubbing  SKIN: warm and dry without lesions    NEURO:  alert, approp, no deficits      Labs  reviewed:   Lab 08/09/15 1617  NA 141  K 3.7  CL 103  CO2 32  BUN 21  CREATININE 1.33  GLUCOSE 122*     Lab 08/09/15 1617  HGB 13.4  HCT 40.2  WBC 7.1  PLT 241.0  Eos 0.5    Lab Results  Component Value Date   TSH 1.10 08/09/2015     Lab Results  Component Value Date   PROBNP 29.0 08/09/2015      I personally reviewed images and agree with radiology impression as follows:  CXR:  08/09/15 No evidence of lobar pneumonia or edema, however, the chest x-ray is suggestive of hilar lymphadenopathy, and further evaluation with chest CT is recommended.        Assessment:

## 2015-09-03 DIAGNOSIS — G894 Chronic pain syndrome: Secondary | ICD-10-CM | POA: Diagnosis not present

## 2015-09-03 DIAGNOSIS — G890 Central pain syndrome: Secondary | ICD-10-CM | POA: Diagnosis not present

## 2015-09-03 DIAGNOSIS — F4542 Pain disorder with related psychological factors: Secondary | ICD-10-CM | POA: Diagnosis not present

## 2015-09-03 DIAGNOSIS — Z8673 Personal history of transient ischemic attack (TIA), and cerebral infarction without residual deficits: Secondary | ICD-10-CM | POA: Diagnosis not present

## 2015-09-03 DIAGNOSIS — M159 Polyosteoarthritis, unspecified: Secondary | ICD-10-CM | POA: Diagnosis not present

## 2015-09-03 DIAGNOSIS — G609 Hereditary and idiopathic neuropathy, unspecified: Secondary | ICD-10-CM | POA: Diagnosis not present

## 2015-09-03 DIAGNOSIS — M797 Fibromyalgia: Secondary | ICD-10-CM | POA: Diagnosis not present

## 2015-09-03 LAB — ALLERGY FULL PROFILE
Allergen, D pternoyssinus,d7: <0.1 kU/L
Allergen,Goose feathers, e70: <0.1 kU/L
Box Elder IgE: <0.1 kU/L
Candida Albicans: <0.1 kU/L
Common Ragweed: <0.1 kU/L
D. farinae: <0.1 kU/L
Dog Dander: <0.1 kU/L
G005 Rye, Perennial: <0.1 kU/L
Goldenrod: <0.1 kU/L
Helminthosporium halodes: <0.1 kU/L
House Dust Hollister: <0.1 kU/L
IgE (Immunoglobulin E), Serum: 26 kU/L (ref <115)
Lamb's Quarters: <0.1 kU/L
Stemphylium Botryosum: <0.1 kU/L
Sycamore Tree: <0.1 kU/L
Timothy Grass: <0.1 kU/L

## 2015-09-08 NOTE — Assessment & Plan Note (Addendum)
Allergy profile:  08/09/2015 Eos 0.5  - 09/02/15 >  IgE 26 with Neg RAST - Sinus CT next step  Strongly convinced given nl spirometry with active symptoms that this is  Classic Upper airway cough syndrome, so named because it's frequently impossible to sort out how much is  CR/sinusitis with freq throat clearing (which can be related to primary GERD)   vs  causing  secondary (" extra esophageal")  GERD from wide swings in gastric pressure that occur with throat clearing, often  promoting self use of mint and menthol lozenges that reduce the lower esophageal sphincter tone and exacerbate the problem further in a cyclical fashion.   These are the same pts (now being labeled as having "irritable larynx syndrome" by some cough centers) who not infrequently have a history of having failed to tolerate ace inhibitors,  dry powder inhalers or biphosphonates or report having atypical reflux symptoms that don't respond to standard doses of PPI , and are easily confused as having aecopd or asthma flares by even experienced allergists/ pulmonologists.  For now rx with max gerd/eliminate cyclical cough with tramadol  eval for sinus dz/ add 1st gen h1 if sinus CT neg Other possibility is eos bronchitis in ddx which also tends to be steroid resp short term and the problme is that inhalers tend to make this worse so hold off for now   I had an extended discussion with the patient reviewing all relevant studies completed to date and  lasting 15 to 20 minutes of a 25 minute visit    Each maintenance medication was reviewed in detail including most importantly the difference between maintenance and prns and under what circumstances the prns are to be triggered using an action plan format that is not reflected in the computer generated alphabetically organized AVS.    Please see instructions for details which were reviewed in writing and the patient given a copy highlighting the part that I personally wrote and discussed  at today's ov.

## 2015-09-08 NOTE — Assessment & Plan Note (Addendum)
08/09/2015  extensive coaching HFA effectiveness =    90% > try stiolto > no better so d/cd - spirometry 09/02/2015 no sign airflow obst so this is not copd and not even sure there is AB    Ok to use combivent prn

## 2015-09-08 NOTE — Assessment & Plan Note (Signed)
08/09/2015   Walked RA  2 laps @ 185 ft each stopped due to  Cough and sob/ nl pace, no desat  CTa chest next step looking for bronchiectasis and f/u the ? Adenopathy seen on previous cxr

## 2015-09-08 NOTE — Assessment & Plan Note (Signed)
rx per Dr Nyra Capes with noct 02 2lpm  - HC03  32 08/09/2015 c/w mild hypercarbia  - did not desat walking x 2 laps 08/09/2015 RA  Most likely obesity related/ no need for daytime 02 at this point

## 2015-09-10 ENCOUNTER — Other Ambulatory Visit: Payer: Self-pay | Admitting: Internal Medicine

## 2015-09-10 DIAGNOSIS — G250 Essential tremor: Secondary | ICD-10-CM | POA: Diagnosis not present

## 2015-09-10 DIAGNOSIS — J342 Deviated nasal septum: Secondary | ICD-10-CM | POA: Diagnosis not present

## 2015-09-10 DIAGNOSIS — I2699 Other pulmonary embolism without acute cor pulmonale: Secondary | ICD-10-CM | POA: Diagnosis not present

## 2015-09-10 DIAGNOSIS — I10 Essential (primary) hypertension: Secondary | ICD-10-CM | POA: Diagnosis not present

## 2015-09-10 DIAGNOSIS — J441 Chronic obstructive pulmonary disease with (acute) exacerbation: Secondary | ICD-10-CM | POA: Diagnosis not present

## 2015-09-10 DIAGNOSIS — G44229 Chronic tension-type headache, not intractable: Secondary | ICD-10-CM | POA: Diagnosis not present

## 2015-09-10 DIAGNOSIS — G4733 Obstructive sleep apnea (adult) (pediatric): Secondary | ICD-10-CM | POA: Diagnosis not present

## 2015-09-10 DIAGNOSIS — Z6834 Body mass index (BMI) 34.0-34.9, adult: Secondary | ICD-10-CM | POA: Diagnosis not present

## 2015-09-10 DIAGNOSIS — G2581 Restless legs syndrome: Secondary | ICD-10-CM | POA: Diagnosis not present

## 2015-09-10 DIAGNOSIS — I2510 Atherosclerotic heart disease of native coronary artery without angina pectoris: Secondary | ICD-10-CM | POA: Diagnosis not present

## 2015-09-10 DIAGNOSIS — G4710 Hypersomnia, unspecified: Secondary | ICD-10-CM | POA: Diagnosis not present

## 2015-09-10 DIAGNOSIS — R0600 Dyspnea, unspecified: Secondary | ICD-10-CM | POA: Diagnosis not present

## 2015-09-10 MED ORDER — RIVAROXABAN 15 MG PO TABS: 15.0000 mg | ORAL_TABLET | Freq: Two times a day (BID) | ORAL | Status: DC

## 2015-09-11 ENCOUNTER — Telehealth: Payer: Self-pay | Admitting: Internal Medicine

## 2015-09-11 DIAGNOSIS — I2699 Other pulmonary embolism without acute cor pulmonale: Secondary | ICD-10-CM | POA: Diagnosis not present

## 2015-09-11 DIAGNOSIS — J439 Emphysema, unspecified: Secondary | ICD-10-CM | POA: Diagnosis not present

## 2015-09-11 MED ORDER — APIXABAN 5 MG PO TABS: 5.0000 mg | ORAL_TABLET | Freq: Two times a day (BID) | ORAL | Status: DC

## 2015-09-11 NOTE — Telephone Encounter (Signed)
Pt's wife returned call - [phone]

## 2015-09-11 NOTE — Telephone Encounter (Signed)
Spoke with pt's wife. States that their insurance is not going to cover Xarelto. They will need an alternative.  MW - please advise. Thanks.

## 2015-09-11 NOTE — Telephone Encounter (Signed)
lmtcb x1 for pt. 

## 2015-09-11 NOTE — Telephone Encounter (Signed)
Pt's wife is aware of MW's recommendations. Rx for Eliquis has been sent in. Advised her to let us know if this is not approved.

## 2015-09-11 NOTE — Telephone Encounter (Signed)
Tanda Rockers, MD at 09/11/2015 9:26 AM     Status: Signed       Expand All Collapse All   eliquis is the only other option = eliquis 5 mg bid but if the insurance won't cover that no choice but go to ER to get lovenox and coumadin started as can't do this over the phone (could get primary to do it but we don't normally start this in the office and I doubt the primary will either )       Spoke with pt's wife. She states that insurance will not cover Eliquis. MW's recommendations have been given to her again. They are going to go [facility]. Nothing further was needed at this time.

## 2015-09-11 NOTE — Telephone Encounter (Signed)
eliquis is the only other option = eliquis 5 mg bid but if the insurance won't cover that no choice but go to ER to get lovenox and coumadin started as can't do this over the phone (could get primary to do it but we don't normally start this in the office and I doubt the primary will either )

## 2015-09-12 ENCOUNTER — Encounter (HOSPITAL_COMMUNITY): Payer: Medicare Other

## 2015-09-13 DIAGNOSIS — I2699 Other pulmonary embolism without acute cor pulmonale: Secondary | ICD-10-CM | POA: Diagnosis not present

## 2015-09-13 DIAGNOSIS — Z86711 Personal history of pulmonary embolism: Secondary | ICD-10-CM | POA: Diagnosis not present

## 2015-09-17 DIAGNOSIS — R7302 Impaired glucose tolerance (oral): Secondary | ICD-10-CM | POA: Diagnosis not present

## 2015-09-17 DIAGNOSIS — I10 Essential (primary) hypertension: Secondary | ICD-10-CM | POA: Diagnosis not present

## 2015-09-17 DIAGNOSIS — I2699 Other pulmonary embolism without acute cor pulmonale: Secondary | ICD-10-CM | POA: Diagnosis not present

## 2015-09-17 DIAGNOSIS — E782 Mixed hyperlipidemia: Secondary | ICD-10-CM | POA: Diagnosis not present

## 2015-09-19 ENCOUNTER — Encounter: Payer: Self-pay | Admitting: Adult Health

## 2015-09-19 ENCOUNTER — Ambulatory Visit (INDEPENDENT_AMBULATORY_CARE_PROVIDER_SITE_OTHER): Payer: Medicare Other | Admitting: Adult Health

## 2015-09-19 ENCOUNTER — Telehealth: Payer: Self-pay | Admitting: Internal Medicine

## 2015-09-19 ENCOUNTER — Other Ambulatory Visit: Payer: Self-pay | Admitting: Adult Health

## 2015-09-19 ENCOUNTER — Other Ambulatory Visit (INDEPENDENT_AMBULATORY_CARE_PROVIDER_SITE_OTHER): Payer: Medicare Other

## 2015-09-19 VITALS — BP 130/70 | HR 78 | Temp 98.3°F | Ht 74.0 in | Wt 268.0 lb

## 2015-09-19 DIAGNOSIS — I2699 Other pulmonary embolism without acute cor pulmonale: Secondary | ICD-10-CM

## 2015-09-19 DIAGNOSIS — K921 Melena: Secondary | ICD-10-CM | POA: Diagnosis not present

## 2015-09-19 LAB — CBC WITH DIFFERENTIAL/PLATELET
BASOS ABS: 0.1 10*3/uL (ref 0.0–0.1)
Basophils Relative: 1 % (ref 0.0–3.0)
EOS ABS: 0.4 10*3/uL (ref 0.0–0.7)
Eosinophils Relative: 6.5 % — ABNORMAL HIGH (ref 0.0–5.0)
HCT: 40.8 % (ref 39.0–52.0)
Hemoglobin: 13.7 g/dL (ref 13.0–17.0)
LYMPHS ABS: 2.3 10*3/uL (ref 0.7–4.0)
Lymphocytes Relative: 36.9 % (ref 12.0–46.0)
MCHC: 33.6 g/dL (ref 30.0–36.0)
MCV: 86.9 fl (ref 78.0–100.0)
MONO ABS: 0.6 10*3/uL (ref 0.1–1.0)
Monocytes Relative: 9.1 % (ref 3.0–12.0)
NEUTROS ABS: 2.9 10*3/uL (ref 1.4–7.7)
NEUTROS PCT: 46.5 % (ref 43.0–77.0)
PLATELETS: 261 10*3/uL (ref 150.0–400.0)
RBC: 4.7 Mil/uL (ref 4.22–5.81)
RDW: 13.5 % (ref 11.5–15.5)
WBC: 6.2 10*3/uL (ref 4.0–10.5)

## 2015-09-19 LAB — PROTIME-INR
INR: 1.8 ratio — AB (ref 0.8–1.0)
Prothrombin Time: 18.9 s — ABNORMAL HIGH (ref 9.6–13.1)

## 2015-09-19 NOTE — Addendum Note (Signed)
Addended by: Osa Craver on: 09/19/2015 04:25 PM   Modules accepted: Orders

## 2015-09-19 NOTE — Telephone Encounter (Signed)
Result Note      INR is decreased off coumadin as it should be     Blood count is normal no sign of blood loss.     If bloody stool (chronic issue ) continues will need to be seen by GI - skip tonights dose of Eliquis -    Chest xray was not done , will need to have this done.     Recommend delsym Twice daily As needed Cough , if does not stop will need to consider change of meds.     Please contact office for sooner follow up if symptoms do not improve or worsen or seek emergency care    ---  I spoke with patient about results and he verbalized understanding and had no questions. He will go to [facility] in the AM to have CXR done. FYI to [employer]

## 2015-09-19 NOTE — Patient Instructions (Signed)
Labs today  Hold Elquis today until you hear from me regarding your labs /follow up Dr. Melvyn Novas  In 3-4 weeks and As needed

## 2015-09-19 NOTE — Assessment & Plan Note (Signed)
Small right-sided PE , questionable provoked with symptoms beginning shortly after knee surgery 6 months ago. Patient does have increased bloody stools and now with questionable blood tinged mucus We'll place anticoagulation on therapy at this point. Patient advised not to use any aspirin or NSAIDs He was recently on Celebrex and this has been stopped. We'll check an INR and a CBC stat Chest x-ray stat Case discussed with Dr. Melvyn Novas May need to change back to Coumadin if ongoing bleeding is going to be an issue .

## 2015-09-19 NOTE — Progress Notes (Signed)
Chart and office note reviewed in detail along with  abs > agree with a/p as outlined though the a/p has a typo: should state hold anticogulation for now until labs obtained

## 2015-09-19 NOTE — Progress Notes (Signed)
Subjective:    Patient ID: Luis Bryan, male    DOB: 11-Jun-1951, 64 y.o.   MRN: [id number]  HPI 64 year old male former smoker , presented for pulmonary consult 08/09/2015. For shortness of breath.found to have a PE.   TEST  CT chest 09/10/2015 showed positive small volume PE in the right middle lobe , CT sinus 09/10/2015 with no acute process, mild septal deviation Venous Dopplers 09/13/2015 negative for DVT .   09/19/2015 Follow up : PE  Patient returns for a two-week follow-up. Patient was seen for persistent shortness of breath is been occurring over the last 6 months after knee surgery. Patient was sent for a CT chest on December 13 that showed a small PE. Patient was recommended to begin anticoagulation therapy. Initially was sent for Xarelto however, insurance would not cover. Patient was started on Coumadin and Lovenox bridge.. Patient says that he is starting to feel better with decreased shortness of breath. Patient says that he does have intermittent chronic rectal bleeding. He says he is an extensive GI workup.Marland Kitchen Has noticed that this has increased since starting his blood thinners.. Patient was seen by his primary care physician 2 days ago and was changed to Eliquis .  He needed prior authorization for his insurance to cover this and this was done by his primary care physician. Has noticed since starting Elquis that he is coughing up brown mucus. He denies any fever, chest pain, orthopnea, PND, or increased leg swelling.    Past Medical History  Diagnosis Date  . H/O: stroke   . Skin cancer   . Arthritis   . COPD (chronic obstructive pulmonary disease) ([place])    . Current Outpatient Prescriptions on File Prior to Visit  Medication Sig Dispense Refill  . albuterol-ipratropium (COMBIVENT) 18-103 MCG/ACT inhaler Inhale 2 puffs into the lungs every 4 (four) hours.    Marland Kitchen apixaban (ELIQUIS) 5 MG TABS tablet Take 1 tablet (5 mg total) by mouth 2 (two) times daily. 60  tablet 5  . DEXILANT 60 MG capsule     . fenofibrate 160 MG tablet     . gabapentin (NEURONTIN) 600 MG tablet Take 1,200 mg by mouth 3 (three) times daily.    . metoprolol succinate (TOPROL-XL) 25 MG 24 hr tablet Take 25 mg by mouth.    . primidone (MYSOLINE) 50 MG tablet Take 50 mg by mouth.    . ranitidine (ZANTAC) 150 MG tablet     . Respiratory Therapy Supplies (FLUTTER) DEVI AS DIRECTED 1 each 0  . rosuvastatin (CRESTOR) 10 MG tablet Take 10 mg by mouth.    . traMADol (ULTRAM) 50 MG tablet 1-2 every 4 hours as needed for cough or pain 40 tablet 0  . valsartan-hydrochlorothiazide (DIOVAN-HCT) 160-25 MG tablet Take 1 tablet by mouth daily.    Marland Kitchen amitriptyline (ELAVIL) 75 MG tablet Take 50 mg by mouth 2 (two) times daily. Reported on 09/19/2015    . celecoxib (CELEBREX) 200 MG capsule Take 200 mg by mouth 2 (two) times daily. Reported on 09/19/2015    . fluticasone (FLONASE) 50 MCG/ACT nasal spray Place 2 sprays into the nose. Reported on 09/19/2015    . Rivaroxaban (XARELTO) 15 MG TABS tablet Take 1 tablet (15 mg total) by mouth 2 (two) times daily with a meal. (Patient not taking: Reported on 09/19/2015) 42 tablet 0   No current facility-administered medications on file prior to visit.       Review of Systems Constitutional:  No  weight loss, night sweats,  Fevers, chills, fatigue, or  lassitude.  HEENT:   No headaches,  Difficulty swallowing,  Tooth/dental problems, or  Sore throat,                No sneezing, itching, ear ache, nasal congestion, post nasal drip,   CV:  No chest pain,  Orthopnea, PND, swelling in lower extremities, anasarca, dizziness, palpitations, syncope.   GI  No heartburn, indigestion, abdominal pain, nausea, vomiting, diarrhea, change in bowel habits, loss of appetite,  +bloody stools.   Resp: No shortness of breath with exertion or at rest.  No excess mucus, no productive cough,  No non-productive cough,     No change in color of mucus.  No wheezing.  No  chest wall deformity  Skin: no rash or lesions.  GU: no dysuria, change in color of urine, no urgency or frequency.  No flank pain, no hematuria   MS:  No joint pain or swelling.  No decreased range of motion.  No back pain.  Psych:  No change in mood or affect. No depression or anxiety.  No memory loss.    '    Objective:   Physical Exam   Filed Vitals:   09/19/15 1220  BP: 130/70  Pulse: 78  Temp: 98.3 F (36.8 C)  TempSrc: Oral  Height: 6\' 2"  (1.88 m)  Weight: 268 lb (121.564 kg)  SpO2: 93%    GEN: A/Ox3; pleasant , NAD, obese   HEENT:  [place]/AT,  EACs-clear, TMs-wnl, NOSE-clear, THROAT-clear, no lesions, no postnasal drip or exudate noted.   NECK:  Supple w/ fair ROM; no JVD; normal carotid impulses w/o bruits; no thyromegaly or nodules palpated; no lymphadenopathy.  RESP  Clear  P & A; w/o, wheezes/ rales/ or rhonchi.no accessory muscle use, no dullness to percussion  CARD:  RRR, no m/r/g  , no peripheral edema, pulses intact, no cyanosis or clubbing.  GI:   Soft & nt; nml bowel sounds; no organomegaly or masses detected.  Musco: Warm bil, no deformities or joint swelling noted.   Neuro: alert, no focal deficits noted.    Skin: Warm, no lesions or rashes         Assessment & Plan:

## 2015-09-19 NOTE — Assessment & Plan Note (Signed)
?   Pt reports chronic rectal bleeding -unknown etiology  This is worse on Eliquis  Will need to hold Eliquis and check INR /CBC stat

## 2015-09-20 NOTE — Telephone Encounter (Signed)
Called and spoke to pt's wife. Pt's wife states the pt is not feeling well (nauseas and diarrhea) and will not be able to get CXR today but will do it on Tuesday 12.27.16.   Will forward to TP and [place] as FYI.

## 2015-09-20 NOTE — Telephone Encounter (Signed)
Called and spoke with the patient. He stated that he was not feeling well and was experiencing nausea and diarrhea. He denied any hemoptysis, cough, or fever. I explained to him the importance of seeking emergency care if his symptoms worsen. He voiced understanding and had no further questions.

## 2015-09-24 ENCOUNTER — Telehealth: Payer: Self-pay | Admitting: Internal Medicine

## 2015-09-24 DIAGNOSIS — K921 Melena: Secondary | ICD-10-CM | POA: Diagnosis not present

## 2015-09-24 DIAGNOSIS — J9612 Chronic respiratory failure with hypercapnia: Principal | ICD-10-CM

## 2015-09-24 DIAGNOSIS — I2699 Other pulmonary embolism without acute cor pulmonale: Secondary | ICD-10-CM | POA: Diagnosis not present

## 2015-09-24 DIAGNOSIS — R748 Abnormal levels of other serum enzymes: Secondary | ICD-10-CM | POA: Diagnosis not present

## 2015-09-24 DIAGNOSIS — Z8673 Personal history of transient ischemic attack (TIA), and cerebral infarction without residual deficits: Secondary | ICD-10-CM | POA: Diagnosis not present

## 2015-09-24 DIAGNOSIS — R0602 Shortness of breath: Secondary | ICD-10-CM | POA: Diagnosis not present

## 2015-09-24 DIAGNOSIS — J9611 Chronic respiratory failure with hypoxia: Secondary | ICD-10-CM

## 2015-09-24 DIAGNOSIS — J449 Chronic obstructive pulmonary disease, unspecified: Secondary | ICD-10-CM | POA: Diagnosis not present

## 2015-09-24 NOTE — Telephone Encounter (Signed)
Pt had CXR done at [facility] (can be pulled in PACS).  Please advise MW thanks

## 2015-09-24 NOTE — Telephone Encounter (Signed)
Called spoke with spouse. She reports pt will go have CXR doen sometime today at [facility]. She will call us to let us know once this is done since TP is on vacation this week. Also she wants to change DME companies. She reports they always have a hard time with [employer] trying to get supplies ordered for pt O2 machine. Order has been placed to change DME's. Spouse request [facility]. I have placed order. Nothing further needed

## 2015-09-25 NOTE — Telephone Encounter (Signed)
Spoke with pt's spouse, aware of results.  Nothing further needed.  

## 2015-09-25 NOTE — Telephone Encounter (Signed)
cxr was fine.

## 2015-10-01 ENCOUNTER — Ambulatory Visit (INDEPENDENT_AMBULATORY_CARE_PROVIDER_SITE_OTHER): Payer: Medicare Other | Admitting: Adult Health

## 2015-10-01 ENCOUNTER — Encounter: Payer: Self-pay | Admitting: Adult Health

## 2015-10-01 ENCOUNTER — Telehealth: Payer: Self-pay | Admitting: Internal Medicine

## 2015-10-01 VITALS — BP 122/86 | HR 80 | Temp 98.3°F | Ht 74.0 in | Wt 271.0 lb

## 2015-10-01 DIAGNOSIS — I2690 Septic pulmonary embolism without acute cor pulmonale: Secondary | ICD-10-CM

## 2015-10-01 DIAGNOSIS — K921 Melena: Secondary | ICD-10-CM | POA: Diagnosis not present

## 2015-10-01 DIAGNOSIS — J449 Chronic obstructive pulmonary disease, unspecified: Secondary | ICD-10-CM | POA: Diagnosis not present

## 2015-10-01 MED ORDER — TRAMADOL HCL 50 MG PO TABS: ORAL_TABLET | ORAL | Status: DC

## 2015-10-01 NOTE — Patient Instructions (Signed)
It is fine to change back to coumadin , set up office visit with Family doctor to have monitoring done .  Do not stop Eliquis until coumadin is started.  Call if any signs of bleeding.  May use Combivent 1 puff Four times a day  As needed  Wheezing  Advance activity as tolerated.  Follow up with Dr. Melvyn Novas  In 2 weeks and As needed   Please contact office for sooner follow up if symptoms do not improve or worsen or seek emergency care

## 2015-10-01 NOTE — Progress Notes (Signed)
Subjective:    Patient ID: Luis Bryan, male    DOB: 01-29-1951, 65 y.o.   MRN: [id number]  HPI 65 year old male former smoker , presented for pulmonary consult 08/09/2015. For shortness of breath.found to have a PE.   TEST  CT chest 09/10/2015 showed positive small volume PE in the right middle lobe , CT sinus 09/10/2015 with no acute process, mild septal deviation Venous Dopplers 09/13/2015 negative for DVT .    09/30/14 Follow up : PE  Pt returns to discuss PE treatment. He was found to have a small PE in December on CT chest .  He was started on coumadin  (unable to afford Xarelto) , then switched to Eliquis by PCP .  He says he thinks Eliquis is making him feel bad with headaches , fatigue. No visual /speech changes . No arm weakness. No severe headaches , just dull ones.  He wants to change back to Coumadin .  Also complains of persistent cough, tightness and dry cough. No fever , neck stiffness, discolored mucus , hemoptysis , or chest pain.  Last ov with brown vs blood tinged mucus. That has resolved.  He also has chronic rectal bleeding for years per pt, this is the same . Slightly less. Says he has seen GI multiple times in past.  Labs last ov with nml hbg. INR was lower at 1.8 . CXR reported as no acute process.  He wants to get labs done through PCP , does not want to drive to [place] for coumadin monitoring.      Past Medical History  Diagnosis Date  . H/O: stroke   . Skin cancer   . Arthritis   . COPD (chronic obstructive pulmonary disease) ([place])    Current Outpatient Prescriptions on File Prior to Visit  Medication Sig Dispense Refill  . albuterol-ipratropium (COMBIVENT) 18-103 MCG/ACT inhaler Inhale 2 puffs into the lungs every 4 (four) hours.    Marland Kitchen amitriptyline (ELAVIL) 75 MG tablet Take 50 mg by mouth 2 (two) times daily. Reported on 09/19/2015    . apixaban (ELIQUIS) 5 MG TABS tablet Take 1 tablet (5 mg total) by mouth 2 (two) times daily. 60 tablet 5  .  DEXILANT 60 MG capsule Take 60 mg by mouth daily.     . fenofibrate 160 MG tablet Take 160 mg by mouth.     . fentaNYL (DURAGESIC - DOSED MCG/HR) 12 MCG/HR Place 1 patch onto the skin every other day.    . fentaNYL (DURAGESIC) 25 MCG/HR patch Place 1 patch onto the skin every other day.    . gabapentin (NEURONTIN) 600 MG tablet Take 1,200 mg by mouth 3 (three) times daily.    . metoprolol succinate (TOPROL-XL) 25 MG 24 hr tablet Take 25 mg by mouth.    . OXYGEN Inhale 2 L/min into the lungs at bedtime.    . primidone (MYSOLINE) 50 MG tablet Take 50 mg by mouth.    . ranitidine (ZANTAC) 150 MG tablet     . Respiratory Therapy Supplies (FLUTTER) DEVI AS DIRECTED 1 each 0  . rosuvastatin (CRESTOR) 10 MG tablet Take 10 mg by mouth.    . valsartan-hydrochlorothiazide (DIOVAN-HCT) 160-25 MG tablet Take 1 tablet by mouth daily.    . celecoxib (CELEBREX) 200 MG capsule Take 200 mg by mouth 2 (two) times daily. Reported on 10/01/2015    . fluticasone (FLONASE) 50 MCG/ACT nasal spray Place 2 sprays into the nose. Reported on 10/01/2015    .  Rivaroxaban (XARELTO) 15 MG TABS tablet Take 1 tablet (15 mg total) by mouth 2 (two) times daily with a meal. (Patient not taking: Reported on 10/01/2015) 42 tablet 0   No current facility-administered medications on file prior to visit.    Review of Systems Constitutional:   No  weight loss, night sweats,  Fevers, chills, fatigue, or  lassitude.  HEENT:   No headaches,  Difficulty swallowing,  Tooth/dental problems, or  Sore throat,                No sneezing, itching, ear ache, nasal congestion, post nasal drip,   CV:  No chest pain,  Orthopnea, PND, swelling in lower extremities, anasarca, dizziness, palpitations, syncope.   GI  No heartburn, indigestion, abdominal pain, nausea, vomiting, diarrhea, change in bowel habits, loss of appetite,  +bloody stools.   Resp: No shortness of breath with exertion or at rest.  No excess mucus, no productive cough,  No  non-productive cough,     No change in color of mucus.  No wheezing.  No chest wall deformity  Skin: no rash or lesions.  GU: no dysuria, change in color of urine, no urgency or frequency.  No flank pain, no hematuria   MS:  No joint pain or swelling.  No decreased range of motion.  No back pain.  Psych:  No change in mood or affect. No depression or anxiety.  No memory loss.    '    Objective:   Physical Exam  Filed Vitals:   10/01/15 1625  BP: 122/86  Pulse: 80  Temp: 98.3 F (36.8 C)  TempSrc: Oral  Height: 6\' 2"  (1.88 m)  Weight: 271 lb (122.925 kg)  SpO2: 97%     GEN: A/Ox3; pleasant , NAD, obese   HEENT:  [place]/AT,  EACs-clear, TMs-wnl, NOSE-clear, THROAT-clear, no lesions, no postnasal drip or exudate noted.   NECK:  Supple w/ fair ROM; no JVD; normal carotid impulses w/o bruits; no thyromegaly or nodules palpated; no lymphadenopathy.  RESP  Clear  P & A; w/o, wheezes/ rales/ or rhonchi.no accessory muscle use, no dullness to percussion  CARD:  RRR, no m/r/g  , no peripheral edema, pulses intact, no cyanosis or clubbing.  GI:   Soft & nt; nml bowel sounds; no organomegaly or masses detected.  Musco: Warm bil, no deformities or joint swelling noted.   Neuro: alert, no focal deficits noted.    Skin: Warm, no lesions or rashes   Lab Results  Component Value Date   WBC 6.2 09/19/2015   HGB 13.7 09/19/2015   HCT 40.8 09/19/2015   MCV 86.9 09/19/2015   PLT 261.0 09/19/2015         Assessment & Plan:

## 2015-10-01 NOTE — Telephone Encounter (Signed)
Spoke with pt's wife. States that pt has increased coughing and wheezing. Requests to be seen today. OV has been scheduled with TP today at 4:15pm. Nothing further was needed.

## 2015-10-03 DIAGNOSIS — J439 Emphysema, unspecified: Secondary | ICD-10-CM | POA: Diagnosis not present

## 2015-10-04 DIAGNOSIS — R05 Cough: Secondary | ICD-10-CM | POA: Diagnosis not present

## 2015-10-04 DIAGNOSIS — I2699 Other pulmonary embolism without acute cor pulmonale: Secondary | ICD-10-CM | POA: Diagnosis not present

## 2015-10-07 NOTE — Assessment & Plan Note (Signed)
Use combivent As needed

## 2015-10-07 NOTE — Assessment & Plan Note (Signed)
Small PE dx on CTa chest 09/10/15 w/ neg venous doppler He does not like Eliquis and would like to change back to Coumadin which I think is appropriate and may be a  Better choice given his chronic rectal bleeding. Recent labs were okay with nml hbg . No acute process on cxr.  He wants to have Coumadin monitored at PCP . He says he will make ov with PCP to discuss changes.  follow up with Dr. Melvyn Novas  As planned in 2 weeks. Can discuss length of therapy at that as this was his first PE ? Unprovoked vs provoked  (did have orthopedic surgery earlier this year) . Would consider tx for at least 6 months as may be hard to differentiate if this was provoked.  Once off anticoagulation may want to check hypercoagulable panel .

## 2015-10-07 NOTE — Assessment & Plan Note (Signed)
Cont to follow up with PCP and GI  hbg is stable

## 2015-10-08 NOTE — Progress Notes (Signed)
Chart and office note reviewed in detail  > agree with a/p as outlined    

## 2015-10-10 ENCOUNTER — Encounter: Payer: Self-pay | Admitting: Internal Medicine

## 2015-10-12 DIAGNOSIS — J439 Emphysema, unspecified: Secondary | ICD-10-CM | POA: Diagnosis not present

## 2015-10-14 ENCOUNTER — Telehealth: Payer: Self-pay | Admitting: Internal Medicine

## 2015-10-14 ENCOUNTER — Encounter: Payer: Self-pay | Admitting: Internal Medicine

## 2015-10-14 ENCOUNTER — Ambulatory Visit (INDEPENDENT_AMBULATORY_CARE_PROVIDER_SITE_OTHER): Payer: Medicare Other | Admitting: Internal Medicine

## 2015-10-14 VITALS — BP 122/86 | HR 85 | Ht 74.0 in | Wt 268.0 lb

## 2015-10-14 DIAGNOSIS — R0600 Dyspnea, unspecified: Secondary | ICD-10-CM | POA: Diagnosis not present

## 2015-10-14 DIAGNOSIS — J449 Chronic obstructive pulmonary disease, unspecified: Secondary | ICD-10-CM

## 2015-10-14 DIAGNOSIS — R05 Cough: Secondary | ICD-10-CM

## 2015-10-14 DIAGNOSIS — I2699 Other pulmonary embolism without acute cor pulmonale: Secondary | ICD-10-CM | POA: Diagnosis not present

## 2015-10-14 DIAGNOSIS — J9612 Chronic respiratory failure with hypercapnia: Secondary | ICD-10-CM

## 2015-10-14 DIAGNOSIS — J9611 Chronic respiratory failure with hypoxia: Secondary | ICD-10-CM

## 2015-10-14 DIAGNOSIS — R058 Other specified cough: Secondary | ICD-10-CM

## 2015-10-14 LAB — NITRIC OXIDE: NITRIC OXIDE: 58

## 2015-10-14 MED ORDER — MOMETASONE FURO-FORMOTEROL FUM 100-5 MCG/ACT IN AERO: INHALATION_SPRAY | RESPIRATORY_TRACT | Status: DC

## 2015-10-14 NOTE — Telephone Encounter (Signed)
Spoke with Melissa. States that they are still working on an order from 08/2015 about pt's nocturnal oxygen. They have tried several times to contact [employer] to have things transferred. [employer] will not call them back. [facility] will need an updated ONO to start pt's oxygen. This has been ordered. Once we receive this report, we will then put in the oxygen order. ONO has been ordered. Nothing further was needed at this time.

## 2015-10-14 NOTE — Patient Instructions (Addendum)
Try dulera 100 Take 2 puffs first thing in am and then another 2 puffs about 12 hours later.    GERD (REFLUX)  is an extremely common cause of respiratory symptoms just like yours , many times with no obvious heartburn at all.    It can be treated with medication, but also with lifestyle changes including elevation of the head of your bed (ideally with 6 inch  bed blocks),  Smoking cessation, avoidance of late meals, excessive alcohol, and avoid fatty foods, chocolate, peppermint, colas, red wine, and acidic juices such as orange juice.  NO MINT OR MENTHOL PRODUCTS SO NO COUGH DROPS  USE SUGARLESS CANDY INSTEAD (Jolley ranchers or Stover's or Life Savers) or even ice chips will also do - the key is to swallow to prevent all throat clearing. NO OIL BASED VITAMINS - use powdered substitutes.  Increase the zantac 150 mg to 2 at night and if not better add chlorpeniramine 4 mg 1-2 at bedtime    Please schedule a follow up office visit in 4 weeks, sooner if needed When return bring your medications in 2 separate bags, the ones you take no matter(automatically)  what vs the as needed (only when you feel you need them)

## 2015-10-14 NOTE — Progress Notes (Signed)
Subjective:    Patient ID: Luis Bryan, male    DOB: 03-23-1951    MRN: [id number]  Brief patient profile:  65 year old male former smoker , presented for pulmonary consult 08/09/2015. For shortness of breath with GOLD 0 copd   W/u   CT chest 09/10/2015 showed positive small volume PE in the right middle lobe , CT sinus 09/10/2015 with no acute process, mild septal deviation Venous Dopplers 09/13/2015 negative for DVT .    09/30/14 NP Follow up : PE  Pt returns to discuss PE treatment. He was found to have a small PE in December on CT chest .  He was started on coumadin  (unable to afford Xarelto) , then switched to Eliquis by PCP .  He says he thinks Eliquis is making him feel bad with headaches , fatigue. No visual /speech changes . No arm weakness. No severe headaches , just dull ones.  He wants to change back to Coumadin .  Also complains of persistent cough, tightness and dry cough. No fever , neck stiffness, discolored mucus , hemoptysis , or chest pain.  Last ov with brown vs blood tinged mucus. That has resolved.  He also has chronic rectal bleeding for years per pt, this is the same . Slightly less. Says he has seen GI multiple times in past.  Labs last ov with nml hbg. INR was lower at 1.8 . CXR reported as no acute process.  He wants to get labs done through PCP , does not want to drive to [place] for coumadin monitoring.  rec It is fine to change back to coumadin , set up office visit with Family doctor to have monitoring done .  Do not stop Eliquis until coumadin is started.  Call if any signs of bleeding.  May use Combivent 1 puff Four times a day  As needed  Wheezing  Advance activity as tolerated.     10/14/2015  f/u ov/Chrisangel Eskenazi re: unexplained sob/cough prob not related to small vol PE  Chief Complaint  Patient presents with  . Follow-up    Pt states that his breathing seems better. He states that the tramadol helps stop cough but makes his HA worse. Cough is still  prod with gray sputum.   cough/ congestion x 8 months / better with prednisone / ? Better p combivent  HA 24/7 x since stopped celebrex /not worse in ams   No obvious day to day or daytime variability or assoc  cp or chest tightness, subjective wheeze or overt sinus or hb symptoms. No unusual exp hx or h/o childhood pna/ asthma or knowledge of premature birth.  Sleeping ok without nocturnal  or early am exacerbation  of respiratory  c/o's or need for noct saba. Also denies any obvious fluctuation of symptoms with weather or environmental changes or other aggravating or alleviating factors except as outlined above   Current Medications, Allergies, Complete Past Medical History, Past Surgical History, Family History, and Social History were reviewed in [employer] record.  ROS  The following are not active complaints unless bolded sore throat, dysphagia, dental problems, itching, sneezing,  nasal congestion or excess/ purulent secretions, ear ache,   fever, chills, sweats, unintended wt loss, classically pleuritic or exertional cp, hemoptysis,  orthopnea pnd or leg swelling, presyncope, palpitations, abdominal pain, anorexia, nausea, vomiting, diarrhea  or change in bowel or bladder habits, change in stools or urine, dysuria,hematuria,  rash, arthralgias, visual complaints, headache, numbness, weakness or ataxia  or problems with walking or coordination,  change in mood/affect or memory.                   Objective:   Physical Exam      GEN: A/Ox3; pleasant , NAD, obese    Wt Readings from Last 3 Encounters:  10/14/15 268 lb (121.564 kg)  10/01/15 271 lb (122.925 kg)  09/19/15 268 lb (121.564 kg)    Vital signs reviewed      HEENT:  [place]/AT,  EACs-clear, TMs-wnl, NOSE-clear, THROAT-clear, no lesions, no postnasal drip or exudate noted.   NECK:  Supple w/ fair ROM; no JVD; normal carotid impulses w/o bruits; no thyromegaly or nodules palpated; no  lymphadenopathy.  RESP  Clear  P & A; w/o, wheezes/ rales/ or rhonchi.no accessory muscle use, no dullness to percussion  CARD:  RRR, no m/r/g  , no peripheral edema, pulses intact, no cyanosis or clubbing.  GI:   Soft & nt; nml bowel sounds; no organomegaly or masses detected.  Musco: Warm bil, no deformities or joint swelling noted.   Neuro: alert, no focal deficits noted.    Skin: Warm, no lesions or rashes             Assessment & Plan:

## 2015-10-15 DIAGNOSIS — I2699 Other pulmonary embolism without acute cor pulmonale: Secondary | ICD-10-CM | POA: Diagnosis not present

## 2015-10-20 ENCOUNTER — Encounter: Payer: Self-pay | Admitting: Internal Medicine

## 2015-10-20 NOTE — Assessment & Plan Note (Signed)
See CTa chest [facility] 09/10/2015 > rx xarelto> insurance denied > to [facility] for lovenox/coumadin 09/11/15  - Venous dopplers 09/13/2015 > neg bilaterally  He had orthopedic surgery 2016 and is morbidly obese so this was "provoked" and needs min of 6 m rx then reassessment of risk / benefit perhaps with hypercoagulable profile p one week off coumadin

## 2015-10-20 NOTE — Assessment & Plan Note (Signed)
Allergy profile:  08/09/2015 Eos 0.5  - 09/02/15 >  IgE 26 with Neg RAST - Sinus CT 09/10/15  >> CT sinus 09/10/2015 with no acute process, mild septal deviation -  NO 10/14/2015 = 58   NO intermediate but pt does report better with pred suggesting this may be asthma > try dulera 100 2bid

## 2015-10-20 NOTE — Assessment & Plan Note (Addendum)
08/09/2015   Walked RA  2 laps @ 185 ft each stopped due to  Cough and sob/ nl pace, no desat - 10/14/2015  Walked RA x 3 laps @ 185 ft each stopped due to  End of study, nl pace, no sob or desat   - 10/14/2015  extensive coaching HFA effectiveness =    75% > try dulera 100 2bid   Symptoms are markedly disproportionate to objective findings and not clear this is all lung problem but pt does appear to have difficult airway management issues.   DDX of  difficult airways management almost all start with A and  include Adherence, Ace Inhibitors, Acid Reflux, Active Sinus Disease, Alpha 1 Antitripsin deficiency, Anxiety masquerading as Airways dz,  ABPA,  Allergy(esp in young), Aspiration (esp in elderly), Adverse effects of meds,  Active smokers, A bunch of PE's (a small clot burden can't cause this syndrome unless there is already severe underlying pulm or vascular dz with poor reserve) plus two Bs  = Bronchiectasis and Beta blocker use..and one C= CHF     Adherence is always the initial "prime suspect" and is a multilayered concern that requires a "trust but verify" approach in every patient - starting with knowing how to use medications, especially inhalers, correctly, keeping up with refills and understanding the fundamental difference between maintenance and prns vs those medications only taken for a very short course and then stopped and not refilled.  - The proper method of use, as well as anticipated side effects, of a metered-dose inhaler are discussed and demonstrated to the patient. Improved effectiveness after extensive coaching during this visit to a level of approximately 75 % from a baseline of 50 %   ? Allergy/asthma/ cough variant vs vcd/ pseudoasthma > try dulera 100 2bid   ? Acid (or non-acid) GERD > always difficult to exclude as up to 75% of pts in some series report no assoc GI/ Heartburn symptoms> rec max (24h)  acid suppression and diet restrictions/ reviewed and instructions given in  writing.   ? A bunch of PE's > ruled out / doubt single small vol PE is contributing at all    I had an extended discussion with the patient reviewing all relevant studies completed to date and  lasting 25 minutes of a 40 minute visit    Each maintenance medication was reviewed in detail including most importantly the difference between maintenance and prns and under what circumstances the prns are to be triggered using an action plan format that is not reflected in the computer generated alphabetically organized AVS.    Please see instructions for details which were reviewed in writing and the patient given a copy highlighting the part that I personally wrote and discussed at today's ov.

## 2015-10-20 NOTE — Assessment & Plan Note (Signed)
08/09/2015  extensive coaching HFA effectiveness =    90% > try stiolto > no better so d/cd - spirometry 09/02/2015 no sign airflow obst so this is not copd and not even sure there is AB   - try dulera 100 2bid 10/20/2015 (see dyspnea   separate a/p)

## 2015-10-22 DIAGNOSIS — L570 Actinic keratosis: Secondary | ICD-10-CM | POA: Diagnosis not present

## 2015-10-22 DIAGNOSIS — L821 Other seborrheic keratosis: Secondary | ICD-10-CM | POA: Diagnosis not present

## 2015-10-22 DIAGNOSIS — L304 Erythema intertrigo: Secondary | ICD-10-CM | POA: Diagnosis not present

## 2015-10-22 DIAGNOSIS — J449 Chronic obstructive pulmonary disease, unspecified: Secondary | ICD-10-CM | POA: Diagnosis not present

## 2015-10-22 DIAGNOSIS — I2699 Other pulmonary embolism without acute cor pulmonale: Secondary | ICD-10-CM | POA: Diagnosis not present

## 2015-10-22 DIAGNOSIS — D225 Melanocytic nevi of trunk: Secondary | ICD-10-CM | POA: Diagnosis not present

## 2015-10-22 DIAGNOSIS — D2239 Melanocytic nevi of other parts of face: Secondary | ICD-10-CM | POA: Diagnosis not present

## 2015-10-29 DIAGNOSIS — I2699 Other pulmonary embolism without acute cor pulmonale: Secondary | ICD-10-CM | POA: Diagnosis not present

## 2015-11-03 DIAGNOSIS — J439 Emphysema, unspecified: Secondary | ICD-10-CM | POA: Diagnosis not present

## 2015-11-05 DIAGNOSIS — I2699 Other pulmonary embolism without acute cor pulmonale: Secondary | ICD-10-CM | POA: Diagnosis not present

## 2015-11-11 ENCOUNTER — Ambulatory Visit: Payer: Medicare Other | Admitting: Internal Medicine

## 2015-11-12 DIAGNOSIS — J439 Emphysema, unspecified: Secondary | ICD-10-CM | POA: Diagnosis not present

## 2015-11-16 IMAGING — DX DG CHEST 2V
2 series · 2 of 2 positions shown · non-contrast
Comparison: None.

CLINICAL DATA: 64-year-old male with a history of worsening
shortness of breath and cough.

EXAM:
CHEST - 2 VIEW

[chest pa]
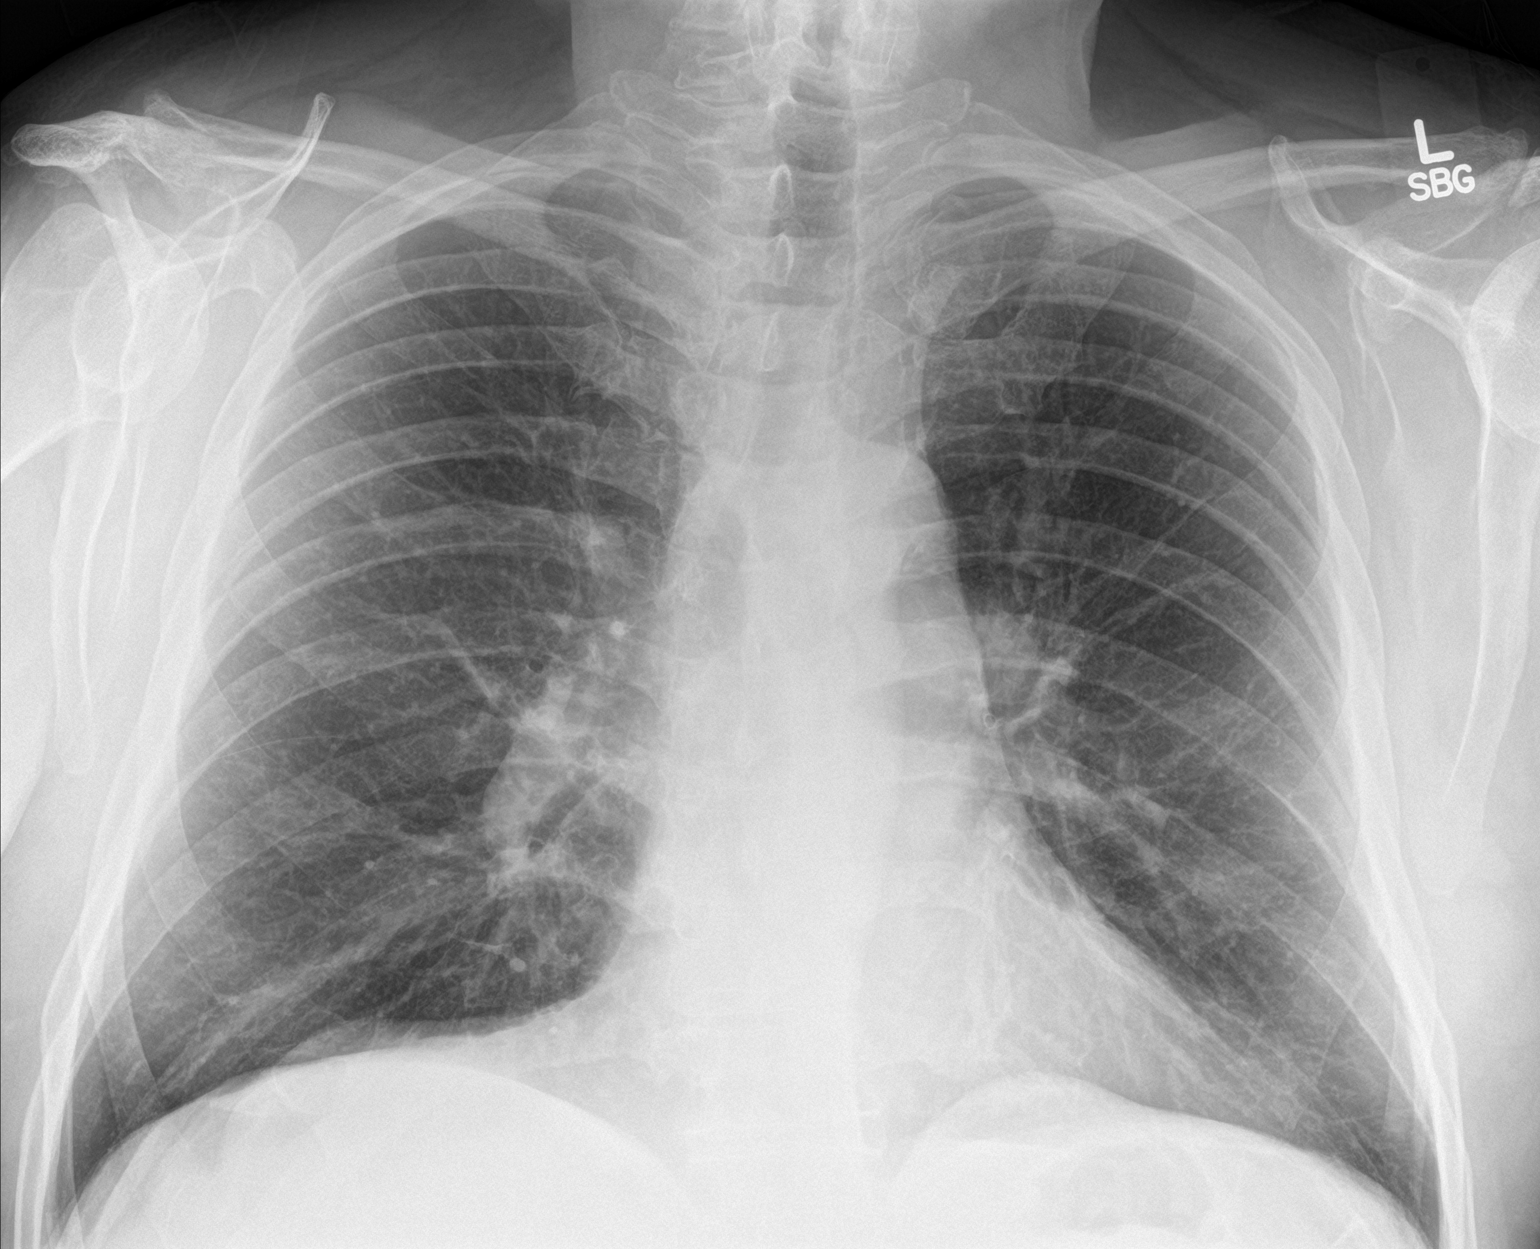

[chest lat]
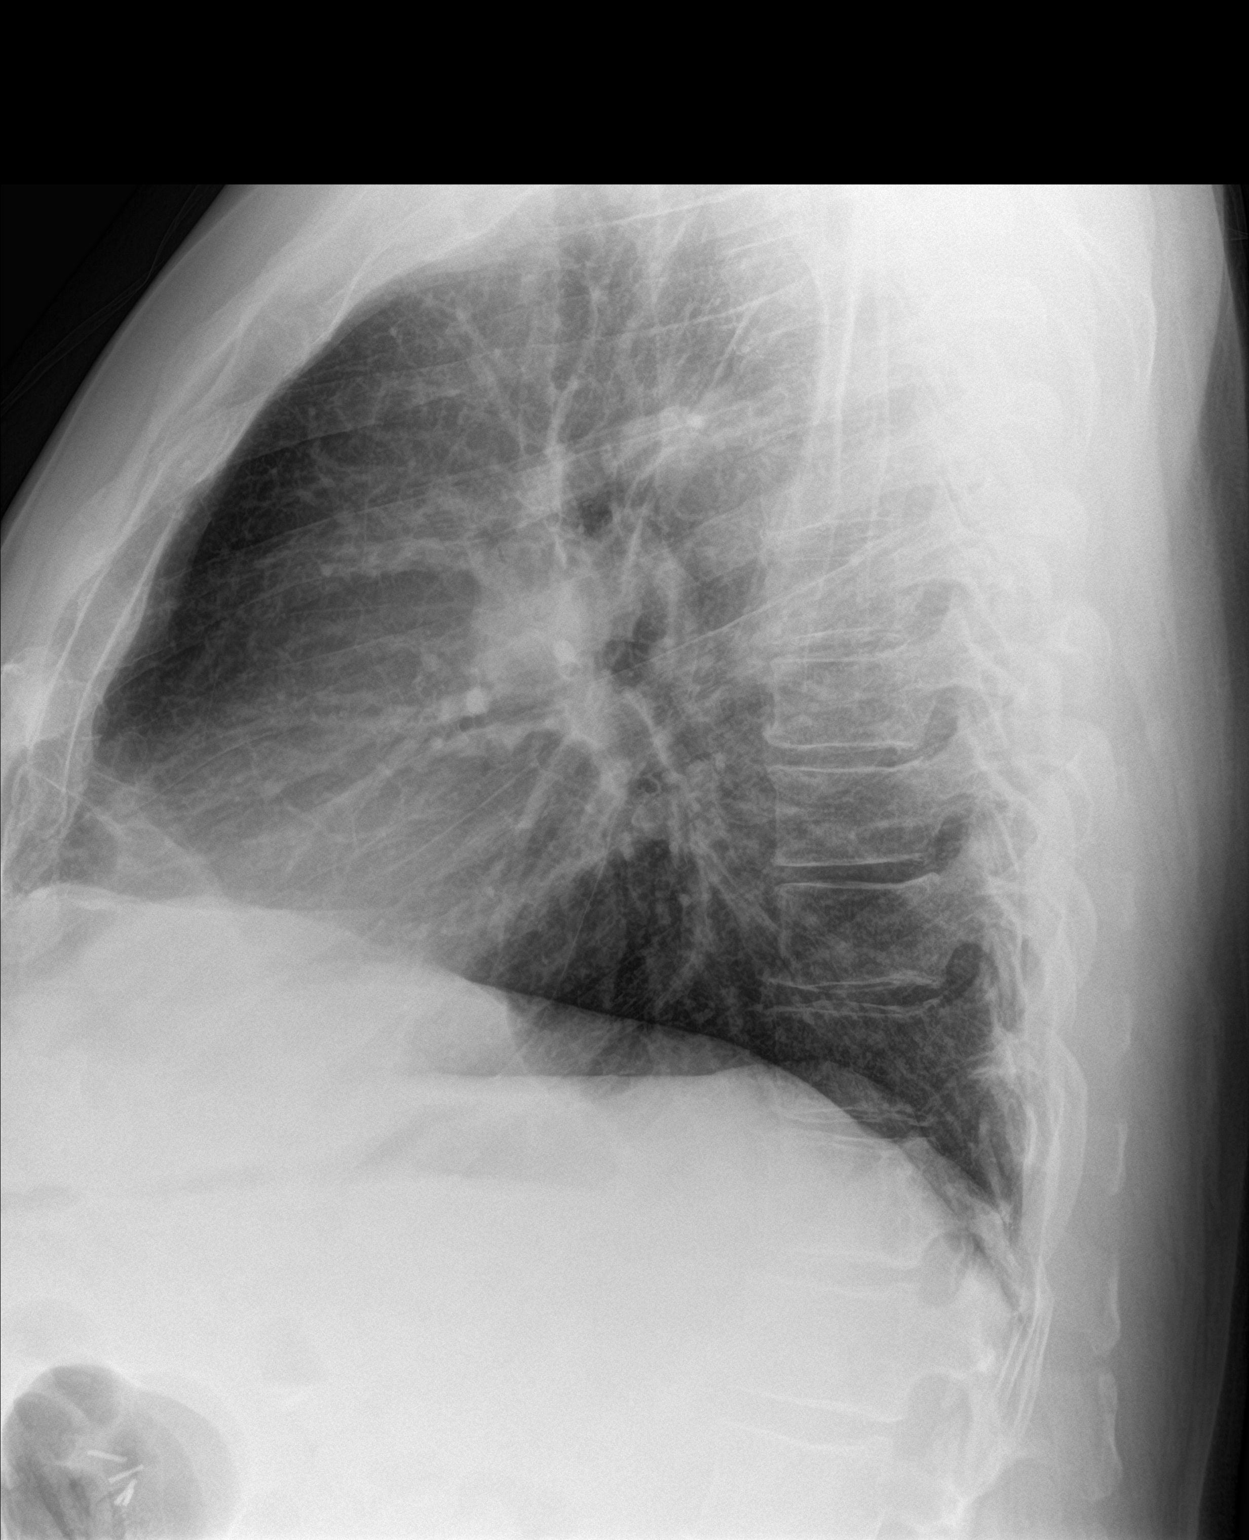

[2 of 2 positions shown; findings below may reference images not displayed]

FINDINGS: Cardiac diameter within normal limits.

Frontal on lateral view suggests hilar lymphadenopathy.

No pneumothorax or pleural effusion.  No confluent airspace disease.

Coarsened interstitial markings.

No displaced fracture.

Unremarkable appearance of the upper abdomen.
IMPRESSION: No evidence of lobar pneumonia or edema, however, the chest x-ray is
suggestive of hilar lymphadenopathy, and further evaluation with
chest CT is recommended.

## 2015-11-19 DIAGNOSIS — R05 Cough: Secondary | ICD-10-CM | POA: Diagnosis not present

## 2015-11-19 DIAGNOSIS — J449 Chronic obstructive pulmonary disease, unspecified: Secondary | ICD-10-CM | POA: Diagnosis not present

## 2015-11-19 DIAGNOSIS — I2699 Other pulmonary embolism without acute cor pulmonale: Secondary | ICD-10-CM | POA: Diagnosis not present

## 2015-11-21 ENCOUNTER — Other Ambulatory Visit: Payer: Self-pay | Admitting: Adult Health

## 2015-11-21 NOTE — Telephone Encounter (Signed)
Rx called into [facility]. Nothing further needed.

## 2015-11-21 NOTE — Telephone Encounter (Signed)
Refill request for Tramadol Last refilled: 10/02/15 Last OV: 10/14/15 No OV scheduled.  Ok to refill?

## 2015-11-22 ENCOUNTER — Telehealth: Payer: Self-pay | Admitting: *Deleted

## 2015-11-22 NOTE — Telephone Encounter (Signed)
-----   Message from Tanda Rockers, MD sent at 11/21/2015  3:13 PM EST ----- Needs ov with me next avail with all meds in hand, no more tramadol s ov

## 2015-11-22 NOTE — Telephone Encounter (Signed)
LMTCB

## 2015-11-25 NOTE — Telephone Encounter (Signed)
LMTCB

## 2015-11-26 DIAGNOSIS — I2699 Other pulmonary embolism without acute cor pulmonale: Secondary | ICD-10-CM | POA: Diagnosis not present

## 2015-11-26 NOTE — Telephone Encounter (Signed)
LMTCB

## 2015-12-01 DIAGNOSIS — J439 Emphysema, unspecified: Secondary | ICD-10-CM | POA: Diagnosis not present

## 2015-12-03 NOTE — Telephone Encounter (Signed)
LMTCB and will close per protocol  

## 2015-12-04 DIAGNOSIS — M797 Fibromyalgia: Secondary | ICD-10-CM | POA: Diagnosis not present

## 2015-12-04 DIAGNOSIS — M1990 Unspecified osteoarthritis, unspecified site: Secondary | ICD-10-CM | POA: Diagnosis not present

## 2015-12-04 DIAGNOSIS — G890 Central pain syndrome: Secondary | ICD-10-CM | POA: Diagnosis not present

## 2015-12-04 DIAGNOSIS — M159 Polyosteoarthritis, unspecified: Secondary | ICD-10-CM | POA: Diagnosis not present

## 2015-12-04 DIAGNOSIS — G894 Chronic pain syndrome: Secondary | ICD-10-CM | POA: Diagnosis not present

## 2015-12-10 DIAGNOSIS — I2699 Other pulmonary embolism without acute cor pulmonale: Secondary | ICD-10-CM | POA: Diagnosis not present

## 2015-12-10 DIAGNOSIS — J439 Emphysema, unspecified: Secondary | ICD-10-CM | POA: Diagnosis not present

## 2015-12-17 DIAGNOSIS — G4710 Hypersomnia, unspecified: Secondary | ICD-10-CM | POA: Diagnosis not present

## 2015-12-17 DIAGNOSIS — G44229 Chronic tension-type headache, not intractable: Secondary | ICD-10-CM | POA: Diagnosis not present

## 2015-12-17 DIAGNOSIS — G2581 Restless legs syndrome: Secondary | ICD-10-CM | POA: Diagnosis not present

## 2015-12-17 DIAGNOSIS — G250 Essential tremor: Secondary | ICD-10-CM | POA: Diagnosis not present

## 2015-12-17 DIAGNOSIS — G4733 Obstructive sleep apnea (adult) (pediatric): Secondary | ICD-10-CM | POA: Diagnosis not present

## 2015-12-24 DIAGNOSIS — I2699 Other pulmonary embolism without acute cor pulmonale: Secondary | ICD-10-CM | POA: Diagnosis not present

## 2015-12-24 DIAGNOSIS — J449 Chronic obstructive pulmonary disease, unspecified: Secondary | ICD-10-CM | POA: Diagnosis not present

## 2015-12-30 DIAGNOSIS — M25562 Pain in left knee: Secondary | ICD-10-CM | POA: Diagnosis not present

## 2015-12-30 DIAGNOSIS — G894 Chronic pain syndrome: Secondary | ICD-10-CM | POA: Diagnosis not present

## 2016-01-01 DIAGNOSIS — J439 Emphysema, unspecified: Secondary | ICD-10-CM | POA: Diagnosis not present

## 2016-01-07 DIAGNOSIS — I2699 Other pulmonary embolism without acute cor pulmonale: Secondary | ICD-10-CM | POA: Diagnosis not present

## 2016-01-10 DIAGNOSIS — J439 Emphysema, unspecified: Secondary | ICD-10-CM | POA: Diagnosis not present

## 2016-01-14 DIAGNOSIS — G8929 Other chronic pain: Secondary | ICD-10-CM | POA: Diagnosis not present

## 2016-01-14 DIAGNOSIS — Z7189 Other specified counseling: Secondary | ICD-10-CM | POA: Diagnosis not present

## 2016-01-14 DIAGNOSIS — I129 Hypertensive chronic kidney disease with stage 1 through stage 4 chronic kidney disease, or unspecified chronic kidney disease: Secondary | ICD-10-CM | POA: Diagnosis not present

## 2016-01-14 DIAGNOSIS — I2699 Other pulmonary embolism without acute cor pulmonale: Secondary | ICD-10-CM | POA: Diagnosis not present

## 2016-01-14 DIAGNOSIS — N182 Chronic kidney disease, stage 2 (mild): Secondary | ICD-10-CM | POA: Diagnosis not present

## 2016-01-28 DIAGNOSIS — I2699 Other pulmonary embolism without acute cor pulmonale: Secondary | ICD-10-CM | POA: Diagnosis not present

## 2016-01-28 DIAGNOSIS — Z1389 Encounter for screening for other disorder: Secondary | ICD-10-CM | POA: Diagnosis not present

## 2016-01-31 DIAGNOSIS — J439 Emphysema, unspecified: Secondary | ICD-10-CM | POA: Diagnosis not present

## 2016-02-03 DIAGNOSIS — S0990XA Unspecified injury of head, initial encounter: Secondary | ICD-10-CM | POA: Diagnosis not present

## 2016-02-03 DIAGNOSIS — R0602 Shortness of breath: Secondary | ICD-10-CM | POA: Diagnosis not present

## 2016-02-03 DIAGNOSIS — H81391 Other peripheral vertigo, right ear: Secondary | ICD-10-CM | POA: Diagnosis not present

## 2016-02-03 DIAGNOSIS — R51 Headache: Secondary | ICD-10-CM | POA: Diagnosis not present

## 2016-02-04 DIAGNOSIS — R7303 Prediabetes: Secondary | ICD-10-CM | POA: Diagnosis not present

## 2016-02-04 DIAGNOSIS — I10 Essential (primary) hypertension: Secondary | ICD-10-CM | POA: Diagnosis not present

## 2016-02-04 DIAGNOSIS — E782 Mixed hyperlipidemia: Secondary | ICD-10-CM | POA: Diagnosis not present

## 2016-02-09 DIAGNOSIS — J439 Emphysema, unspecified: Secondary | ICD-10-CM | POA: Diagnosis not present

## 2016-02-11 DIAGNOSIS — J439 Emphysema, unspecified: Secondary | ICD-10-CM | POA: Diagnosis not present

## 2016-02-11 DIAGNOSIS — R7303 Prediabetes: Secondary | ICD-10-CM | POA: Diagnosis not present

## 2016-02-11 DIAGNOSIS — I129 Hypertensive chronic kidney disease with stage 1 through stage 4 chronic kidney disease, or unspecified chronic kidney disease: Secondary | ICD-10-CM | POA: Diagnosis not present

## 2016-02-11 DIAGNOSIS — N182 Chronic kidney disease, stage 2 (mild): Secondary | ICD-10-CM | POA: Diagnosis not present

## 2016-02-18 DIAGNOSIS — G2581 Restless legs syndrome: Secondary | ICD-10-CM | POA: Diagnosis not present

## 2016-02-18 DIAGNOSIS — G44229 Chronic tension-type headache, not intractable: Secondary | ICD-10-CM | POA: Diagnosis not present

## 2016-02-18 DIAGNOSIS — H81399 Other peripheral vertigo, unspecified ear: Secondary | ICD-10-CM | POA: Diagnosis not present

## 2016-02-18 DIAGNOSIS — G4733 Obstructive sleep apnea (adult) (pediatric): Secondary | ICD-10-CM | POA: Diagnosis not present

## 2016-02-18 DIAGNOSIS — G250 Essential tremor: Secondary | ICD-10-CM | POA: Diagnosis not present

## 2016-02-18 DIAGNOSIS — G4710 Hypersomnia, unspecified: Secondary | ICD-10-CM | POA: Diagnosis not present

## 2016-03-03 DIAGNOSIS — M797 Fibromyalgia: Secondary | ICD-10-CM | POA: Diagnosis not present

## 2016-03-03 DIAGNOSIS — Z7189 Other specified counseling: Secondary | ICD-10-CM | POA: Diagnosis not present

## 2016-03-03 DIAGNOSIS — G2581 Restless legs syndrome: Secondary | ICD-10-CM | POA: Diagnosis not present

## 2016-03-03 DIAGNOSIS — Z1389 Encounter for screening for other disorder: Secondary | ICD-10-CM | POA: Diagnosis not present

## 2016-03-03 DIAGNOSIS — I2699 Other pulmonary embolism without acute cor pulmonale: Secondary | ICD-10-CM | POA: Diagnosis not present

## 2016-03-03 DIAGNOSIS — Z139 Encounter for screening, unspecified: Secondary | ICD-10-CM | POA: Diagnosis not present

## 2016-03-03 DIAGNOSIS — Z0000 Encounter for general adult medical examination without abnormal findings: Secondary | ICD-10-CM | POA: Diagnosis not present

## 2016-03-03 DIAGNOSIS — I129 Hypertensive chronic kidney disease with stage 1 through stage 4 chronic kidney disease, or unspecified chronic kidney disease: Secondary | ICD-10-CM | POA: Diagnosis not present

## 2016-03-03 DIAGNOSIS — N182 Chronic kidney disease, stage 2 (mild): Secondary | ICD-10-CM | POA: Diagnosis not present

## 2016-03-09 DIAGNOSIS — G890 Central pain syndrome: Secondary | ICD-10-CM | POA: Diagnosis not present

## 2016-03-09 DIAGNOSIS — M159 Polyosteoarthritis, unspecified: Secondary | ICD-10-CM | POA: Diagnosis not present

## 2016-03-09 DIAGNOSIS — G894 Chronic pain syndrome: Secondary | ICD-10-CM | POA: Diagnosis not present

## 2016-03-09 DIAGNOSIS — M797 Fibromyalgia: Secondary | ICD-10-CM | POA: Diagnosis not present

## 2016-03-11 DIAGNOSIS — J439 Emphysema, unspecified: Secondary | ICD-10-CM | POA: Diagnosis not present

## 2016-03-12 DIAGNOSIS — I2699 Other pulmonary embolism without acute cor pulmonale: Secondary | ICD-10-CM | POA: Diagnosis not present

## 2016-03-17 DIAGNOSIS — I2699 Other pulmonary embolism without acute cor pulmonale: Secondary | ICD-10-CM | POA: Diagnosis not present

## 2016-03-27 DIAGNOSIS — Z86711 Personal history of pulmonary embolism: Secondary | ICD-10-CM | POA: Diagnosis not present

## 2016-04-07 DIAGNOSIS — J439 Emphysema, unspecified: Secondary | ICD-10-CM | POA: Diagnosis not present

## 2016-04-07 DIAGNOSIS — I129 Hypertensive chronic kidney disease with stage 1 through stage 4 chronic kidney disease, or unspecified chronic kidney disease: Secondary | ICD-10-CM | POA: Diagnosis not present

## 2016-04-07 DIAGNOSIS — N182 Chronic kidney disease, stage 2 (mild): Secondary | ICD-10-CM | POA: Diagnosis not present

## 2016-04-07 DIAGNOSIS — E782 Mixed hyperlipidemia: Secondary | ICD-10-CM | POA: Diagnosis not present

## 2016-04-10 DIAGNOSIS — J439 Emphysema, unspecified: Secondary | ICD-10-CM | POA: Diagnosis not present

## 2016-04-14 DIAGNOSIS — G44229 Chronic tension-type headache, not intractable: Secondary | ICD-10-CM | POA: Diagnosis not present

## 2016-04-14 DIAGNOSIS — R259 Unspecified abnormal involuntary movements: Secondary | ICD-10-CM | POA: Diagnosis not present

## 2016-04-14 DIAGNOSIS — G250 Essential tremor: Secondary | ICD-10-CM | POA: Diagnosis not present

## 2016-04-14 DIAGNOSIS — G2581 Restless legs syndrome: Secondary | ICD-10-CM | POA: Diagnosis not present

## 2016-04-14 DIAGNOSIS — G4710 Hypersomnia, unspecified: Secondary | ICD-10-CM | POA: Diagnosis not present

## 2016-04-16 DIAGNOSIS — G2581 Restless legs syndrome: Secondary | ICD-10-CM | POA: Diagnosis not present

## 2016-04-16 DIAGNOSIS — M797 Fibromyalgia: Secondary | ICD-10-CM | POA: Diagnosis not present

## 2016-04-16 DIAGNOSIS — I2699 Other pulmonary embolism without acute cor pulmonale: Secondary | ICD-10-CM | POA: Diagnosis not present

## 2016-05-05 DIAGNOSIS — Z8673 Personal history of transient ischemic attack (TIA), and cerebral infarction without residual deficits: Secondary | ICD-10-CM | POA: Diagnosis not present

## 2016-05-05 DIAGNOSIS — G894 Chronic pain syndrome: Secondary | ICD-10-CM | POA: Diagnosis not present

## 2016-05-05 DIAGNOSIS — Z79891 Long term (current) use of opiate analgesic: Secondary | ICD-10-CM | POA: Diagnosis not present

## 2016-05-05 DIAGNOSIS — G890 Central pain syndrome: Secondary | ICD-10-CM | POA: Diagnosis not present

## 2016-05-05 DIAGNOSIS — M797 Fibromyalgia: Secondary | ICD-10-CM | POA: Diagnosis not present

## 2016-05-05 DIAGNOSIS — M159 Polyosteoarthritis, unspecified: Secondary | ICD-10-CM | POA: Diagnosis not present

## 2016-05-05 DIAGNOSIS — I10 Essential (primary) hypertension: Secondary | ICD-10-CM | POA: Diagnosis not present

## 2016-05-07 DIAGNOSIS — M797 Fibromyalgia: Secondary | ICD-10-CM | POA: Diagnosis not present

## 2016-05-11 DIAGNOSIS — J439 Emphysema, unspecified: Secondary | ICD-10-CM | POA: Diagnosis not present

## 2016-05-28 DIAGNOSIS — R259 Unspecified abnormal involuntary movements: Secondary | ICD-10-CM | POA: Diagnosis not present

## 2016-05-28 DIAGNOSIS — M797 Fibromyalgia: Secondary | ICD-10-CM | POA: Diagnosis not present

## 2016-06-12 DIAGNOSIS — M797 Fibromyalgia: Secondary | ICD-10-CM | POA: Diagnosis not present

## 2016-06-12 DIAGNOSIS — G890 Central pain syndrome: Secondary | ICD-10-CM | POA: Diagnosis not present

## 2016-06-12 DIAGNOSIS — G894 Chronic pain syndrome: Secondary | ICD-10-CM | POA: Diagnosis not present

## 2016-06-16 DIAGNOSIS — G44229 Chronic tension-type headache, not intractable: Secondary | ICD-10-CM | POA: Diagnosis not present

## 2016-06-16 DIAGNOSIS — R259 Unspecified abnormal involuntary movements: Secondary | ICD-10-CM | POA: Diagnosis not present

## 2016-06-16 DIAGNOSIS — G250 Essential tremor: Secondary | ICD-10-CM | POA: Diagnosis not present

## 2016-06-16 DIAGNOSIS — G2581 Restless legs syndrome: Secondary | ICD-10-CM | POA: Diagnosis not present

## 2016-06-16 DIAGNOSIS — G4710 Hypersomnia, unspecified: Secondary | ICD-10-CM | POA: Diagnosis not present

## 2016-06-27 ENCOUNTER — Other Ambulatory Visit: Payer: Self-pay | Admitting: Internal Medicine

## 2016-07-09 DIAGNOSIS — L570 Actinic keratosis: Secondary | ICD-10-CM | POA: Diagnosis not present

## 2016-07-10 DIAGNOSIS — M797 Fibromyalgia: Secondary | ICD-10-CM | POA: Diagnosis not present

## 2016-07-10 DIAGNOSIS — G890 Central pain syndrome: Secondary | ICD-10-CM | POA: Diagnosis not present

## 2016-07-10 DIAGNOSIS — I10 Essential (primary) hypertension: Secondary | ICD-10-CM | POA: Diagnosis not present

## 2016-07-10 DIAGNOSIS — Z79891 Long term (current) use of opiate analgesic: Secondary | ICD-10-CM | POA: Diagnosis not present

## 2016-07-10 DIAGNOSIS — G20 Parkinson's disease: Secondary | ICD-10-CM | POA: Diagnosis not present

## 2016-07-10 DIAGNOSIS — Z8673 Personal history of transient ischemic attack (TIA), and cerebral infarction without residual deficits: Secondary | ICD-10-CM | POA: Diagnosis not present

## 2016-07-10 DIAGNOSIS — G894 Chronic pain syndrome: Secondary | ICD-10-CM | POA: Diagnosis not present

## 2016-08-06 DIAGNOSIS — R7303 Prediabetes: Secondary | ICD-10-CM | POA: Diagnosis not present

## 2016-08-06 DIAGNOSIS — I129 Hypertensive chronic kidney disease with stage 1 through stage 4 chronic kidney disease, or unspecified chronic kidney disease: Secondary | ICD-10-CM | POA: Diagnosis not present

## 2016-08-06 DIAGNOSIS — Z79899 Other long term (current) drug therapy: Secondary | ICD-10-CM | POA: Diagnosis not present

## 2016-08-06 DIAGNOSIS — E782 Mixed hyperlipidemia: Secondary | ICD-10-CM | POA: Diagnosis not present

## 2016-08-06 DIAGNOSIS — G8929 Other chronic pain: Secondary | ICD-10-CM | POA: Diagnosis not present

## 2016-08-06 DIAGNOSIS — Z23 Encounter for immunization: Secondary | ICD-10-CM | POA: Diagnosis not present

## 2016-08-12 DIAGNOSIS — G890 Central pain syndrome: Secondary | ICD-10-CM | POA: Diagnosis not present

## 2016-08-12 DIAGNOSIS — M797 Fibromyalgia: Secondary | ICD-10-CM | POA: Diagnosis not present

## 2016-08-12 DIAGNOSIS — G894 Chronic pain syndrome: Secondary | ICD-10-CM | POA: Diagnosis not present

## 2016-08-12 DIAGNOSIS — Z79891 Long term (current) use of opiate analgesic: Secondary | ICD-10-CM | POA: Diagnosis not present

## 2016-08-12 DIAGNOSIS — I10 Essential (primary) hypertension: Secondary | ICD-10-CM | POA: Diagnosis not present

## 2016-09-01 DIAGNOSIS — M797 Fibromyalgia: Secondary | ICD-10-CM | POA: Diagnosis not present

## 2016-09-01 DIAGNOSIS — R259 Unspecified abnormal involuntary movements: Secondary | ICD-10-CM | POA: Diagnosis not present

## 2016-09-01 DIAGNOSIS — G8929 Other chronic pain: Secondary | ICD-10-CM | POA: Diagnosis not present

## 2016-09-21 DIAGNOSIS — G459 Transient cerebral ischemic attack, unspecified: Secondary | ICD-10-CM | POA: Diagnosis not present

## 2016-09-21 DIAGNOSIS — R531 Weakness: Secondary | ICD-10-CM | POA: Diagnosis not present

## 2016-10-12 DIAGNOSIS — R05 Cough: Secondary | ICD-10-CM | POA: Diagnosis not present

## 2016-11-03 DIAGNOSIS — J449 Chronic obstructive pulmonary disease, unspecified: Secondary | ICD-10-CM | POA: Diagnosis not present

## 2016-11-03 DIAGNOSIS — E782 Mixed hyperlipidemia: Secondary | ICD-10-CM | POA: Diagnosis not present

## 2016-11-03 DIAGNOSIS — N182 Chronic kidney disease, stage 2 (mild): Secondary | ICD-10-CM | POA: Diagnosis not present

## 2016-11-03 DIAGNOSIS — I129 Hypertensive chronic kidney disease with stage 1 through stage 4 chronic kidney disease, or unspecified chronic kidney disease: Secondary | ICD-10-CM | POA: Diagnosis not present

## 2016-11-04 DIAGNOSIS — G894 Chronic pain syndrome: Secondary | ICD-10-CM | POA: Diagnosis not present

## 2016-11-04 DIAGNOSIS — M797 Fibromyalgia: Secondary | ICD-10-CM | POA: Diagnosis not present

## 2016-11-04 DIAGNOSIS — G629 Polyneuropathy, unspecified: Secondary | ICD-10-CM | POA: Diagnosis not present

## 2016-11-04 DIAGNOSIS — Z79891 Long term (current) use of opiate analgesic: Secondary | ICD-10-CM | POA: Diagnosis not present

## 2016-11-04 DIAGNOSIS — M1990 Unspecified osteoarthritis, unspecified site: Secondary | ICD-10-CM | POA: Diagnosis not present

## 2016-11-04 DIAGNOSIS — M159 Polyosteoarthritis, unspecified: Secondary | ICD-10-CM | POA: Diagnosis not present

## 2016-11-04 DIAGNOSIS — G890 Central pain syndrome: Secondary | ICD-10-CM | POA: Diagnosis not present

## 2016-11-17 DIAGNOSIS — G4710 Hypersomnia, unspecified: Secondary | ICD-10-CM | POA: Diagnosis not present

## 2016-11-17 DIAGNOSIS — G459 Transient cerebral ischemic attack, unspecified: Secondary | ICD-10-CM | POA: Diagnosis not present

## 2016-11-17 DIAGNOSIS — G250 Essential tremor: Secondary | ICD-10-CM | POA: Diagnosis not present

## 2016-11-17 DIAGNOSIS — G44229 Chronic tension-type headache, not intractable: Secondary | ICD-10-CM | POA: Diagnosis not present

## 2016-11-17 DIAGNOSIS — R259 Unspecified abnormal involuntary movements: Secondary | ICD-10-CM | POA: Diagnosis not present

## 2016-11-24 DIAGNOSIS — G459 Transient cerebral ischemic attack, unspecified: Secondary | ICD-10-CM | POA: Diagnosis not present

## 2016-11-24 DIAGNOSIS — I10 Essential (primary) hypertension: Secondary | ICD-10-CM | POA: Diagnosis not present

## 2016-11-25 DIAGNOSIS — G459 Transient cerebral ischemic attack, unspecified: Secondary | ICD-10-CM | POA: Diagnosis not present

## 2016-12-08 DIAGNOSIS — G250 Essential tremor: Secondary | ICD-10-CM | POA: Diagnosis not present

## 2016-12-08 DIAGNOSIS — G2581 Restless legs syndrome: Secondary | ICD-10-CM | POA: Diagnosis not present

## 2016-12-08 DIAGNOSIS — G44229 Chronic tension-type headache, not intractable: Secondary | ICD-10-CM | POA: Diagnosis not present

## 2016-12-08 DIAGNOSIS — R259 Unspecified abnormal involuntary movements: Secondary | ICD-10-CM | POA: Diagnosis not present

## 2016-12-08 DIAGNOSIS — G459 Transient cerebral ischemic attack, unspecified: Secondary | ICD-10-CM | POA: Diagnosis not present

## 2016-12-09 DIAGNOSIS — J439 Emphysema, unspecified: Secondary | ICD-10-CM | POA: Diagnosis not present

## 2016-12-15 DIAGNOSIS — R05 Cough: Secondary | ICD-10-CM | POA: Diagnosis not present

## 2016-12-24 DIAGNOSIS — G4733 Obstructive sleep apnea (adult) (pediatric): Secondary | ICD-10-CM | POA: Diagnosis not present

## 2016-12-29 DIAGNOSIS — R197 Diarrhea, unspecified: Secondary | ICD-10-CM | POA: Diagnosis not present

## 2016-12-29 DIAGNOSIS — R232 Flushing: Secondary | ICD-10-CM | POA: Diagnosis not present

## 2017-01-09 DIAGNOSIS — J439 Emphysema, unspecified: Secondary | ICD-10-CM | POA: Diagnosis not present

## 2017-01-21 DIAGNOSIS — M797 Fibromyalgia: Secondary | ICD-10-CM | POA: Diagnosis not present

## 2017-01-21 DIAGNOSIS — Z791 Long term (current) use of non-steroidal anti-inflammatories (NSAID): Secondary | ICD-10-CM | POA: Diagnosis not present

## 2017-01-21 DIAGNOSIS — Z79899 Other long term (current) drug therapy: Secondary | ICD-10-CM | POA: Diagnosis not present

## 2017-01-21 DIAGNOSIS — Z7902 Long term (current) use of antithrombotics/antiplatelets: Secondary | ICD-10-CM | POA: Diagnosis not present

## 2017-01-21 DIAGNOSIS — G894 Chronic pain syndrome: Secondary | ICD-10-CM | POA: Diagnosis not present

## 2017-01-24 DIAGNOSIS — G4733 Obstructive sleep apnea (adult) (pediatric): Secondary | ICD-10-CM | POA: Diagnosis not present

## 2017-02-03 DIAGNOSIS — E7800 Pure hypercholesterolemia, unspecified: Secondary | ICD-10-CM | POA: Diagnosis not present

## 2017-02-03 DIAGNOSIS — M797 Fibromyalgia: Secondary | ICD-10-CM | POA: Diagnosis not present

## 2017-02-03 DIAGNOSIS — G894 Chronic pain syndrome: Secondary | ICD-10-CM | POA: Diagnosis not present

## 2017-02-03 DIAGNOSIS — I10 Essential (primary) hypertension: Secondary | ICD-10-CM | POA: Diagnosis not present

## 2017-02-03 DIAGNOSIS — G890 Central pain syndrome: Secondary | ICD-10-CM | POA: Diagnosis not present

## 2017-02-03 DIAGNOSIS — M1990 Unspecified osteoarthritis, unspecified site: Secondary | ICD-10-CM | POA: Diagnosis not present

## 2017-02-03 DIAGNOSIS — M159 Polyosteoarthritis, unspecified: Secondary | ICD-10-CM | POA: Diagnosis not present

## 2017-02-08 DIAGNOSIS — J439 Emphysema, unspecified: Secondary | ICD-10-CM | POA: Diagnosis not present

## 2017-02-09 DIAGNOSIS — E782 Mixed hyperlipidemia: Secondary | ICD-10-CM | POA: Diagnosis not present

## 2017-02-09 DIAGNOSIS — R7303 Prediabetes: Secondary | ICD-10-CM | POA: Diagnosis not present

## 2017-02-09 DIAGNOSIS — I129 Hypertensive chronic kidney disease with stage 1 through stage 4 chronic kidney disease, or unspecified chronic kidney disease: Secondary | ICD-10-CM | POA: Diagnosis not present

## 2017-02-16 DIAGNOSIS — N182 Chronic kidney disease, stage 2 (mild): Secondary | ICD-10-CM | POA: Diagnosis not present

## 2017-02-16 DIAGNOSIS — R197 Diarrhea, unspecified: Secondary | ICD-10-CM | POA: Diagnosis not present

## 2017-02-16 DIAGNOSIS — I129 Hypertensive chronic kidney disease with stage 1 through stage 4 chronic kidney disease, or unspecified chronic kidney disease: Secondary | ICD-10-CM | POA: Diagnosis not present

## 2017-02-16 DIAGNOSIS — E782 Mixed hyperlipidemia: Secondary | ICD-10-CM | POA: Diagnosis not present

## 2017-02-16 DIAGNOSIS — J449 Chronic obstructive pulmonary disease, unspecified: Secondary | ICD-10-CM | POA: Diagnosis not present

## 2017-02-23 DIAGNOSIS — G4733 Obstructive sleep apnea (adult) (pediatric): Secondary | ICD-10-CM | POA: Diagnosis not present

## 2017-03-11 DIAGNOSIS — J439 Emphysema, unspecified: Secondary | ICD-10-CM | POA: Diagnosis not present

## 2017-03-26 DIAGNOSIS — G4733 Obstructive sleep apnea (adult) (pediatric): Secondary | ICD-10-CM | POA: Diagnosis not present

## 2017-04-10 DIAGNOSIS — J439 Emphysema, unspecified: Secondary | ICD-10-CM | POA: Diagnosis not present

## 2017-04-25 DIAGNOSIS — G4733 Obstructive sleep apnea (adult) (pediatric): Secondary | ICD-10-CM | POA: Diagnosis not present

## 2017-05-06 DIAGNOSIS — M1990 Unspecified osteoarthritis, unspecified site: Secondary | ICD-10-CM | POA: Diagnosis not present

## 2017-05-06 DIAGNOSIS — M159 Polyosteoarthritis, unspecified: Secondary | ICD-10-CM | POA: Diagnosis not present

## 2017-05-06 DIAGNOSIS — M797 Fibromyalgia: Secondary | ICD-10-CM | POA: Diagnosis not present

## 2017-05-06 DIAGNOSIS — G894 Chronic pain syndrome: Secondary | ICD-10-CM | POA: Diagnosis not present

## 2017-05-06 DIAGNOSIS — G890 Central pain syndrome: Secondary | ICD-10-CM | POA: Diagnosis not present

## 2017-05-11 DIAGNOSIS — J439 Emphysema, unspecified: Secondary | ICD-10-CM | POA: Diagnosis not present

## 2017-05-19 DIAGNOSIS — I129 Hypertensive chronic kidney disease with stage 1 through stage 4 chronic kidney disease, or unspecified chronic kidney disease: Secondary | ICD-10-CM | POA: Diagnosis not present

## 2017-05-19 DIAGNOSIS — R7303 Prediabetes: Secondary | ICD-10-CM | POA: Diagnosis not present

## 2017-05-19 DIAGNOSIS — E782 Mixed hyperlipidemia: Secondary | ICD-10-CM | POA: Diagnosis not present

## 2017-05-26 DIAGNOSIS — G4733 Obstructive sleep apnea (adult) (pediatric): Secondary | ICD-10-CM | POA: Diagnosis not present

## 2017-05-27 DIAGNOSIS — Z7189 Other specified counseling: Secondary | ICD-10-CM | POA: Diagnosis not present

## 2017-05-27 DIAGNOSIS — Z139 Encounter for screening, unspecified: Secondary | ICD-10-CM | POA: Diagnosis not present

## 2017-05-27 DIAGNOSIS — N182 Chronic kidney disease, stage 2 (mild): Secondary | ICD-10-CM | POA: Diagnosis not present

## 2017-05-27 DIAGNOSIS — R7303 Prediabetes: Secondary | ICD-10-CM | POA: Diagnosis not present

## 2017-05-27 DIAGNOSIS — J3089 Other allergic rhinitis: Secondary | ICD-10-CM | POA: Diagnosis not present

## 2017-05-27 DIAGNOSIS — E782 Mixed hyperlipidemia: Secondary | ICD-10-CM | POA: Diagnosis not present

## 2017-05-27 DIAGNOSIS — Z789 Other specified health status: Secondary | ICD-10-CM | POA: Diagnosis not present

## 2017-05-27 DIAGNOSIS — I129 Hypertensive chronic kidney disease with stage 1 through stage 4 chronic kidney disease, or unspecified chronic kidney disease: Secondary | ICD-10-CM | POA: Diagnosis not present

## 2017-05-28 DIAGNOSIS — J3089 Other allergic rhinitis: Secondary | ICD-10-CM | POA: Diagnosis not present

## 2017-05-28 DIAGNOSIS — J301 Allergic rhinitis due to pollen: Secondary | ICD-10-CM | POA: Diagnosis not present

## 2017-06-01 DIAGNOSIS — J301 Allergic rhinitis due to pollen: Secondary | ICD-10-CM | POA: Diagnosis not present

## 2017-06-01 DIAGNOSIS — J3089 Other allergic rhinitis: Secondary | ICD-10-CM | POA: Diagnosis not present

## 2017-06-02 DIAGNOSIS — J301 Allergic rhinitis due to pollen: Secondary | ICD-10-CM | POA: Diagnosis not present

## 2017-06-02 DIAGNOSIS — J3089 Other allergic rhinitis: Secondary | ICD-10-CM | POA: Diagnosis not present

## 2017-06-03 DIAGNOSIS — J301 Allergic rhinitis due to pollen: Secondary | ICD-10-CM | POA: Diagnosis not present

## 2017-06-03 DIAGNOSIS — J3089 Other allergic rhinitis: Secondary | ICD-10-CM | POA: Diagnosis not present

## 2017-06-04 DIAGNOSIS — J3089 Other allergic rhinitis: Secondary | ICD-10-CM | POA: Diagnosis not present

## 2017-06-04 DIAGNOSIS — J301 Allergic rhinitis due to pollen: Secondary | ICD-10-CM | POA: Diagnosis not present

## 2017-06-07 DIAGNOSIS — J301 Allergic rhinitis due to pollen: Secondary | ICD-10-CM | POA: Diagnosis not present

## 2017-06-07 DIAGNOSIS — J3089 Other allergic rhinitis: Secondary | ICD-10-CM | POA: Diagnosis not present

## 2017-06-08 DIAGNOSIS — M791 Myalgia: Secondary | ICD-10-CM | POA: Diagnosis not present

## 2017-06-08 DIAGNOSIS — M545 Low back pain: Secondary | ICD-10-CM | POA: Diagnosis not present

## 2017-06-08 DIAGNOSIS — M1990 Unspecified osteoarthritis, unspecified site: Secondary | ICD-10-CM | POA: Diagnosis not present

## 2017-06-08 DIAGNOSIS — G8929 Other chronic pain: Secondary | ICD-10-CM | POA: Diagnosis not present

## 2017-06-11 DIAGNOSIS — J439 Emphysema, unspecified: Secondary | ICD-10-CM | POA: Diagnosis not present

## 2017-06-22 DIAGNOSIS — R609 Edema, unspecified: Secondary | ICD-10-CM | POA: Diagnosis not present

## 2017-06-22 DIAGNOSIS — M79609 Pain in unspecified limb: Secondary | ICD-10-CM | POA: Diagnosis not present

## 2017-06-22 DIAGNOSIS — Z139 Encounter for screening, unspecified: Secondary | ICD-10-CM | POA: Diagnosis not present

## 2017-06-22 DIAGNOSIS — M7989 Other specified soft tissue disorders: Secondary | ICD-10-CM | POA: Diagnosis not present

## 2017-06-22 DIAGNOSIS — G4733 Obstructive sleep apnea (adult) (pediatric): Secondary | ICD-10-CM | POA: Diagnosis not present

## 2017-06-22 DIAGNOSIS — R069 Unspecified abnormalities of breathing: Secondary | ICD-10-CM | POA: Diagnosis not present

## 2017-06-22 DIAGNOSIS — R0602 Shortness of breath: Secondary | ICD-10-CM | POA: Diagnosis not present

## 2017-06-22 DIAGNOSIS — M79605 Pain in left leg: Secondary | ICD-10-CM | POA: Diagnosis not present

## 2017-06-26 DIAGNOSIS — G4733 Obstructive sleep apnea (adult) (pediatric): Secondary | ICD-10-CM | POA: Diagnosis not present

## 2017-07-06 DIAGNOSIS — R0602 Shortness of breath: Secondary | ICD-10-CM | POA: Diagnosis not present

## 2017-07-06 DIAGNOSIS — M79609 Pain in unspecified limb: Secondary | ICD-10-CM | POA: Diagnosis not present

## 2017-07-06 DIAGNOSIS — G8929 Other chronic pain: Secondary | ICD-10-CM | POA: Diagnosis not present

## 2017-07-06 DIAGNOSIS — M1990 Unspecified osteoarthritis, unspecified site: Secondary | ICD-10-CM | POA: Diagnosis not present

## 2017-07-06 DIAGNOSIS — M545 Low back pain: Secondary | ICD-10-CM | POA: Diagnosis not present

## 2017-07-11 DIAGNOSIS — J439 Emphysema, unspecified: Secondary | ICD-10-CM | POA: Diagnosis not present

## 2017-07-14 DIAGNOSIS — R7989 Other specified abnormal findings of blood chemistry: Secondary | ICD-10-CM | POA: Diagnosis not present

## 2017-07-14 DIAGNOSIS — I700 Atherosclerosis of aorta: Secondary | ICD-10-CM | POA: Diagnosis not present

## 2017-07-15 DIAGNOSIS — R609 Edema, unspecified: Secondary | ICD-10-CM | POA: Diagnosis not present

## 2017-07-15 DIAGNOSIS — Z789 Other specified health status: Secondary | ICD-10-CM | POA: Diagnosis not present

## 2017-07-26 DIAGNOSIS — G4733 Obstructive sleep apnea (adult) (pediatric): Secondary | ICD-10-CM | POA: Diagnosis not present

## 2017-08-03 DIAGNOSIS — G8929 Other chronic pain: Secondary | ICD-10-CM | POA: Diagnosis not present

## 2017-08-03 DIAGNOSIS — M1990 Unspecified osteoarthritis, unspecified site: Secondary | ICD-10-CM | POA: Diagnosis not present

## 2017-08-03 DIAGNOSIS — M545 Low back pain: Secondary | ICD-10-CM | POA: Diagnosis not present

## 2017-08-11 DIAGNOSIS — J439 Emphysema, unspecified: Secondary | ICD-10-CM | POA: Diagnosis not present

## 2017-08-13 DIAGNOSIS — I129 Hypertensive chronic kidney disease with stage 1 through stage 4 chronic kidney disease, or unspecified chronic kidney disease: Secondary | ICD-10-CM | POA: Diagnosis not present

## 2017-08-13 DIAGNOSIS — E782 Mixed hyperlipidemia: Secondary | ICD-10-CM | POA: Diagnosis not present

## 2017-08-13 DIAGNOSIS — R7303 Prediabetes: Secondary | ICD-10-CM | POA: Diagnosis not present

## 2017-08-26 DIAGNOSIS — I10 Essential (primary) hypertension: Secondary | ICD-10-CM | POA: Diagnosis not present

## 2017-08-26 DIAGNOSIS — Z23 Encounter for immunization: Secondary | ICD-10-CM | POA: Diagnosis not present

## 2017-08-26 DIAGNOSIS — E1159 Type 2 diabetes mellitus with other circulatory complications: Secondary | ICD-10-CM | POA: Diagnosis not present

## 2017-08-26 DIAGNOSIS — G4733 Obstructive sleep apnea (adult) (pediatric): Secondary | ICD-10-CM | POA: Diagnosis not present

## 2017-08-26 DIAGNOSIS — E785 Hyperlipidemia, unspecified: Secondary | ICD-10-CM | POA: Diagnosis not present

## 2017-08-26 DIAGNOSIS — E1169 Type 2 diabetes mellitus with other specified complication: Secondary | ICD-10-CM | POA: Diagnosis not present

## 2017-08-26 DIAGNOSIS — E118 Type 2 diabetes mellitus with unspecified complications: Secondary | ICD-10-CM | POA: Diagnosis not present

## 2017-09-10 DIAGNOSIS — J439 Emphysema, unspecified: Secondary | ICD-10-CM | POA: Diagnosis not present

## 2017-09-25 DIAGNOSIS — G4733 Obstructive sleep apnea (adult) (pediatric): Secondary | ICD-10-CM | POA: Diagnosis not present

## 2017-10-18 DIAGNOSIS — Z789 Other specified health status: Secondary | ICD-10-CM | POA: Diagnosis not present

## 2017-10-18 DIAGNOSIS — G8929 Other chronic pain: Secondary | ICD-10-CM | POA: Diagnosis not present

## 2017-10-20 DIAGNOSIS — M797 Fibromyalgia: Secondary | ICD-10-CM | POA: Diagnosis not present

## 2017-10-20 DIAGNOSIS — G890 Central pain syndrome: Secondary | ICD-10-CM | POA: Diagnosis not present

## 2017-10-20 DIAGNOSIS — M159 Polyosteoarthritis, unspecified: Secondary | ICD-10-CM | POA: Diagnosis not present

## 2017-10-20 DIAGNOSIS — G894 Chronic pain syndrome: Secondary | ICD-10-CM | POA: Diagnosis not present

## 2017-11-16 DIAGNOSIS — G890 Central pain syndrome: Secondary | ICD-10-CM | POA: Diagnosis not present

## 2017-11-16 DIAGNOSIS — M159 Polyosteoarthritis, unspecified: Secondary | ICD-10-CM | POA: Diagnosis not present

## 2017-11-16 DIAGNOSIS — G894 Chronic pain syndrome: Secondary | ICD-10-CM | POA: Diagnosis not present

## 2017-11-16 DIAGNOSIS — M797 Fibromyalgia: Secondary | ICD-10-CM | POA: Diagnosis not present

## 2017-11-17 DIAGNOSIS — I129 Hypertensive chronic kidney disease with stage 1 through stage 4 chronic kidney disease, or unspecified chronic kidney disease: Secondary | ICD-10-CM | POA: Diagnosis not present

## 2017-11-17 DIAGNOSIS — E782 Mixed hyperlipidemia: Secondary | ICD-10-CM | POA: Diagnosis not present

## 2017-11-17 DIAGNOSIS — R7303 Prediabetes: Secondary | ICD-10-CM | POA: Diagnosis not present

## 2017-11-25 DIAGNOSIS — E1169 Type 2 diabetes mellitus with other specified complication: Secondary | ICD-10-CM | POA: Diagnosis not present

## 2017-11-25 DIAGNOSIS — I10 Essential (primary) hypertension: Secondary | ICD-10-CM | POA: Diagnosis not present

## 2017-11-25 DIAGNOSIS — E785 Hyperlipidemia, unspecified: Secondary | ICD-10-CM | POA: Diagnosis not present

## 2017-11-25 DIAGNOSIS — E1159 Type 2 diabetes mellitus with other circulatory complications: Secondary | ICD-10-CM | POA: Diagnosis not present

## 2017-12-09 DIAGNOSIS — I10 Essential (primary) hypertension: Secondary | ICD-10-CM | POA: Diagnosis not present

## 2017-12-09 DIAGNOSIS — E1159 Type 2 diabetes mellitus with other circulatory complications: Secondary | ICD-10-CM | POA: Diagnosis not present

## 2017-12-23 DIAGNOSIS — E1169 Type 2 diabetes mellitus with other specified complication: Secondary | ICD-10-CM | POA: Diagnosis not present

## 2017-12-23 DIAGNOSIS — E785 Hyperlipidemia, unspecified: Secondary | ICD-10-CM | POA: Diagnosis not present

## 2017-12-23 DIAGNOSIS — I10 Essential (primary) hypertension: Secondary | ICD-10-CM | POA: Diagnosis not present

## 2017-12-23 DIAGNOSIS — E1159 Type 2 diabetes mellitus with other circulatory complications: Secondary | ICD-10-CM | POA: Diagnosis not present

## 2017-12-27 DIAGNOSIS — M79641 Pain in right hand: Secondary | ICD-10-CM | POA: Diagnosis not present

## 2017-12-27 DIAGNOSIS — M25521 Pain in right elbow: Secondary | ICD-10-CM | POA: Diagnosis not present

## 2018-01-17 DIAGNOSIS — C4442 Squamous cell carcinoma of skin of scalp and neck: Secondary | ICD-10-CM | POA: Diagnosis not present

## 2018-01-17 DIAGNOSIS — L570 Actinic keratosis: Secondary | ICD-10-CM | POA: Diagnosis not present

## 2018-01-17 DIAGNOSIS — L814 Other melanin hyperpigmentation: Secondary | ICD-10-CM | POA: Diagnosis not present

## 2018-01-19 DIAGNOSIS — G894 Chronic pain syndrome: Secondary | ICD-10-CM | POA: Diagnosis not present

## 2018-01-19 DIAGNOSIS — M797 Fibromyalgia: Secondary | ICD-10-CM | POA: Diagnosis not present

## 2018-01-19 DIAGNOSIS — M1990 Unspecified osteoarthritis, unspecified site: Secondary | ICD-10-CM | POA: Diagnosis not present

## 2018-01-19 DIAGNOSIS — M159 Polyosteoarthritis, unspecified: Secondary | ICD-10-CM | POA: Diagnosis not present

## 2018-01-19 DIAGNOSIS — G890 Central pain syndrome: Secondary | ICD-10-CM | POA: Diagnosis not present

## 2018-02-22 DIAGNOSIS — W57XXXA Bitten or stung by nonvenomous insect and other nonvenomous arthropods, initial encounter: Secondary | ICD-10-CM | POA: Diagnosis not present

## 2018-02-22 DIAGNOSIS — L03119 Cellulitis of unspecified part of limb: Secondary | ICD-10-CM | POA: Diagnosis not present

## 2018-02-22 DIAGNOSIS — Z139 Encounter for screening, unspecified: Secondary | ICD-10-CM | POA: Diagnosis not present

## 2018-03-09 DIAGNOSIS — G4733 Obstructive sleep apnea (adult) (pediatric): Secondary | ICD-10-CM | POA: Diagnosis not present

## 2018-03-09 DIAGNOSIS — S61411A Laceration without foreign body of right hand, initial encounter: Secondary | ICD-10-CM | POA: Diagnosis not present

## 2018-03-09 DIAGNOSIS — M797 Fibromyalgia: Secondary | ICD-10-CM | POA: Diagnosis not present

## 2018-03-09 DIAGNOSIS — Z87891 Personal history of nicotine dependence: Secondary | ICD-10-CM | POA: Diagnosis not present

## 2018-03-09 DIAGNOSIS — J449 Chronic obstructive pulmonary disease, unspecified: Secondary | ICD-10-CM | POA: Diagnosis not present

## 2018-03-09 DIAGNOSIS — S61210A Laceration without foreign body of right index finger without damage to nail, initial encounter: Secondary | ICD-10-CM | POA: Diagnosis not present

## 2018-03-09 DIAGNOSIS — I10 Essential (primary) hypertension: Secondary | ICD-10-CM | POA: Diagnosis not present

## 2018-03-09 DIAGNOSIS — E119 Type 2 diabetes mellitus without complications: Secondary | ICD-10-CM | POA: Diagnosis not present

## 2018-03-09 DIAGNOSIS — S61201A Unspecified open wound of left index finger without damage to nail, initial encounter: Secondary | ICD-10-CM | POA: Diagnosis not present

## 2018-03-09 DIAGNOSIS — G8929 Other chronic pain: Secondary | ICD-10-CM | POA: Diagnosis not present

## 2018-03-09 DIAGNOSIS — Z79899 Other long term (current) drug therapy: Secondary | ICD-10-CM | POA: Diagnosis not present

## 2018-03-27 DIAGNOSIS — E1169 Type 2 diabetes mellitus with other specified complication: Secondary | ICD-10-CM | POA: Diagnosis not present

## 2018-03-27 DIAGNOSIS — E1159 Type 2 diabetes mellitus with other circulatory complications: Secondary | ICD-10-CM | POA: Diagnosis not present

## 2018-03-27 DIAGNOSIS — E785 Hyperlipidemia, unspecified: Secondary | ICD-10-CM | POA: Diagnosis not present

## 2018-03-27 DIAGNOSIS — I10 Essential (primary) hypertension: Secondary | ICD-10-CM | POA: Diagnosis not present

## 2018-04-07 DIAGNOSIS — E782 Mixed hyperlipidemia: Secondary | ICD-10-CM | POA: Diagnosis not present

## 2018-04-07 DIAGNOSIS — I129 Hypertensive chronic kidney disease with stage 1 through stage 4 chronic kidney disease, or unspecified chronic kidney disease: Secondary | ICD-10-CM | POA: Diagnosis not present

## 2018-04-07 DIAGNOSIS — R7303 Prediabetes: Secondary | ICD-10-CM | POA: Diagnosis not present

## 2018-04-08 DIAGNOSIS — G8929 Other chronic pain: Secondary | ICD-10-CM | POA: Diagnosis not present

## 2018-04-08 DIAGNOSIS — M25561 Pain in right knee: Secondary | ICD-10-CM | POA: Diagnosis not present

## 2018-04-08 DIAGNOSIS — Z9889 Other specified postprocedural states: Secondary | ICD-10-CM | POA: Diagnosis not present

## 2018-04-08 DIAGNOSIS — M1711 Unilateral primary osteoarthritis, right knee: Secondary | ICD-10-CM | POA: Diagnosis not present

## 2018-04-14 DIAGNOSIS — E1159 Type 2 diabetes mellitus with other circulatory complications: Secondary | ICD-10-CM | POA: Diagnosis not present

## 2018-04-14 DIAGNOSIS — E1169 Type 2 diabetes mellitus with other specified complication: Secondary | ICD-10-CM | POA: Diagnosis not present

## 2018-04-14 DIAGNOSIS — E118 Type 2 diabetes mellitus with unspecified complications: Secondary | ICD-10-CM | POA: Diagnosis not present

## 2018-04-14 DIAGNOSIS — E785 Hyperlipidemia, unspecified: Secondary | ICD-10-CM | POA: Diagnosis not present

## 2018-04-14 DIAGNOSIS — Z139 Encounter for screening, unspecified: Secondary | ICD-10-CM | POA: Diagnosis not present

## 2018-04-14 DIAGNOSIS — I10 Essential (primary) hypertension: Secondary | ICD-10-CM | POA: Diagnosis not present

## 2018-04-14 DIAGNOSIS — Z0000 Encounter for general adult medical examination without abnormal findings: Secondary | ICD-10-CM | POA: Diagnosis not present

## 2018-04-19 DIAGNOSIS — G894 Chronic pain syndrome: Secondary | ICD-10-CM | POA: Diagnosis not present

## 2018-04-19 DIAGNOSIS — M159 Polyosteoarthritis, unspecified: Secondary | ICD-10-CM | POA: Diagnosis not present

## 2018-04-19 DIAGNOSIS — M797 Fibromyalgia: Secondary | ICD-10-CM | POA: Diagnosis not present

## 2018-04-19 DIAGNOSIS — G890 Central pain syndrome: Secondary | ICD-10-CM | POA: Diagnosis not present

## 2018-04-27 DIAGNOSIS — E1159 Type 2 diabetes mellitus with other circulatory complications: Secondary | ICD-10-CM | POA: Diagnosis not present

## 2018-04-27 DIAGNOSIS — E118 Type 2 diabetes mellitus with unspecified complications: Secondary | ICD-10-CM | POA: Diagnosis not present

## 2018-04-27 DIAGNOSIS — E785 Hyperlipidemia, unspecified: Secondary | ICD-10-CM | POA: Diagnosis not present

## 2018-04-27 DIAGNOSIS — E1169 Type 2 diabetes mellitus with other specified complication: Secondary | ICD-10-CM | POA: Diagnosis not present

## 2018-05-27 DIAGNOSIS — E118 Type 2 diabetes mellitus with unspecified complications: Secondary | ICD-10-CM | POA: Diagnosis not present

## 2018-05-27 DIAGNOSIS — E1169 Type 2 diabetes mellitus with other specified complication: Secondary | ICD-10-CM | POA: Diagnosis not present

## 2018-05-27 DIAGNOSIS — E785 Hyperlipidemia, unspecified: Secondary | ICD-10-CM | POA: Diagnosis not present

## 2018-05-27 DIAGNOSIS — E1159 Type 2 diabetes mellitus with other circulatory complications: Secondary | ICD-10-CM | POA: Diagnosis not present

## 2018-06-03 DIAGNOSIS — Z139 Encounter for screening, unspecified: Secondary | ICD-10-CM | POA: Diagnosis not present

## 2018-06-03 DIAGNOSIS — N182 Chronic kidney disease, stage 2 (mild): Secondary | ICD-10-CM | POA: Diagnosis not present

## 2018-06-03 DIAGNOSIS — I129 Hypertensive chronic kidney disease with stage 1 through stage 4 chronic kidney disease, or unspecified chronic kidney disease: Secondary | ICD-10-CM | POA: Diagnosis not present

## 2018-06-07 DIAGNOSIS — M1711 Unilateral primary osteoarthritis, right knee: Secondary | ICD-10-CM | POA: Diagnosis not present

## 2018-06-07 DIAGNOSIS — E119 Type 2 diabetes mellitus without complications: Secondary | ICD-10-CM | POA: Diagnosis not present

## 2018-06-13 DIAGNOSIS — M25461 Effusion, right knee: Secondary | ICD-10-CM | POA: Diagnosis not present

## 2018-06-13 DIAGNOSIS — Z01818 Encounter for other preprocedural examination: Secondary | ICD-10-CM | POA: Diagnosis not present

## 2018-06-13 DIAGNOSIS — M19071 Primary osteoarthritis, right ankle and foot: Secondary | ICD-10-CM | POA: Diagnosis not present

## 2018-06-13 DIAGNOSIS — M1711 Unilateral primary osteoarthritis, right knee: Secondary | ICD-10-CM | POA: Diagnosis not present

## 2018-06-27 DIAGNOSIS — E118 Type 2 diabetes mellitus with unspecified complications: Secondary | ICD-10-CM | POA: Diagnosis not present

## 2018-06-27 DIAGNOSIS — E1169 Type 2 diabetes mellitus with other specified complication: Secondary | ICD-10-CM | POA: Diagnosis not present

## 2018-06-27 DIAGNOSIS — E785 Hyperlipidemia, unspecified: Secondary | ICD-10-CM | POA: Diagnosis not present

## 2018-06-27 DIAGNOSIS — E1159 Type 2 diabetes mellitus with other circulatory complications: Secondary | ICD-10-CM | POA: Diagnosis not present

## 2018-07-12 DIAGNOSIS — G894 Chronic pain syndrome: Secondary | ICD-10-CM | POA: Diagnosis not present

## 2018-07-12 DIAGNOSIS — M797 Fibromyalgia: Secondary | ICD-10-CM | POA: Diagnosis not present

## 2018-07-12 DIAGNOSIS — G890 Central pain syndrome: Secondary | ICD-10-CM | POA: Diagnosis not present

## 2018-07-12 DIAGNOSIS — M159 Polyosteoarthritis, unspecified: Secondary | ICD-10-CM | POA: Diagnosis not present

## 2018-07-18 DIAGNOSIS — M1711 Unilateral primary osteoarthritis, right knee: Secondary | ICD-10-CM | POA: Diagnosis not present

## 2018-07-18 DIAGNOSIS — Z87891 Personal history of nicotine dependence: Secondary | ICD-10-CM | POA: Diagnosis not present

## 2018-07-18 DIAGNOSIS — E785 Hyperlipidemia, unspecified: Secondary | ICD-10-CM | POA: Diagnosis not present

## 2018-07-18 DIAGNOSIS — Z79899 Other long term (current) drug therapy: Secondary | ICD-10-CM | POA: Diagnosis not present

## 2018-07-18 DIAGNOSIS — G4730 Sleep apnea, unspecified: Secondary | ICD-10-CM | POA: Diagnosis not present

## 2018-07-18 DIAGNOSIS — Z7951 Long term (current) use of inhaled steroids: Secondary | ICD-10-CM | POA: Diagnosis not present

## 2018-07-18 DIAGNOSIS — J449 Chronic obstructive pulmonary disease, unspecified: Secondary | ICD-10-CM | POA: Diagnosis not present

## 2018-07-18 DIAGNOSIS — K219 Gastro-esophageal reflux disease without esophagitis: Secondary | ICD-10-CM | POA: Diagnosis not present

## 2018-07-18 DIAGNOSIS — Z01810 Encounter for preprocedural cardiovascular examination: Secondary | ICD-10-CM | POA: Diagnosis not present

## 2018-07-18 DIAGNOSIS — Z888 Allergy status to other drugs, medicaments and biological substances status: Secondary | ICD-10-CM | POA: Diagnosis not present

## 2018-07-18 DIAGNOSIS — Z8673 Personal history of transient ischemic attack (TIA), and cerebral infarction without residual deficits: Secondary | ICD-10-CM | POA: Diagnosis not present

## 2018-07-18 DIAGNOSIS — Z01812 Encounter for preprocedural laboratory examination: Secondary | ICD-10-CM | POA: Diagnosis not present

## 2018-07-18 DIAGNOSIS — G4733 Obstructive sleep apnea (adult) (pediatric): Secondary | ICD-10-CM | POA: Diagnosis not present

## 2018-07-18 DIAGNOSIS — Z7902 Long term (current) use of antithrombotics/antiplatelets: Secondary | ICD-10-CM | POA: Diagnosis not present

## 2018-07-18 DIAGNOSIS — R001 Bradycardia, unspecified: Secondary | ICD-10-CM | POA: Diagnosis not present

## 2018-07-18 DIAGNOSIS — I10 Essential (primary) hypertension: Secondary | ICD-10-CM | POA: Diagnosis not present

## 2018-07-18 DIAGNOSIS — Z01818 Encounter for other preprocedural examination: Secondary | ICD-10-CM | POA: Diagnosis not present

## 2018-07-18 DIAGNOSIS — M797 Fibromyalgia: Secondary | ICD-10-CM | POA: Diagnosis not present

## 2018-07-18 DIAGNOSIS — R51 Headache: Secondary | ICD-10-CM | POA: Diagnosis not present

## 2018-07-28 DIAGNOSIS — E1169 Type 2 diabetes mellitus with other specified complication: Secondary | ICD-10-CM | POA: Diagnosis not present

## 2018-07-28 DIAGNOSIS — E1159 Type 2 diabetes mellitus with other circulatory complications: Secondary | ICD-10-CM | POA: Diagnosis not present

## 2018-07-28 DIAGNOSIS — E785 Hyperlipidemia, unspecified: Secondary | ICD-10-CM | POA: Diagnosis not present

## 2018-07-28 DIAGNOSIS — E118 Type 2 diabetes mellitus with unspecified complications: Secondary | ICD-10-CM | POA: Diagnosis not present

## 2018-08-01 DIAGNOSIS — I10 Essential (primary) hypertension: Secondary | ICD-10-CM | POA: Diagnosis not present

## 2018-08-01 DIAGNOSIS — J449 Chronic obstructive pulmonary disease, unspecified: Secondary | ICD-10-CM | POA: Diagnosis not present

## 2018-08-01 DIAGNOSIS — G8918 Other acute postprocedural pain: Secondary | ICD-10-CM | POA: Diagnosis not present

## 2018-08-01 DIAGNOSIS — G894 Chronic pain syndrome: Secondary | ICD-10-CM | POA: Diagnosis not present

## 2018-08-01 DIAGNOSIS — G20 Parkinson's disease: Secondary | ICD-10-CM | POA: Diagnosis not present

## 2018-08-01 DIAGNOSIS — Z8673 Personal history of transient ischemic attack (TIA), and cerebral infarction without residual deficits: Secondary | ICD-10-CM | POA: Diagnosis not present

## 2018-08-01 DIAGNOSIS — Z79891 Long term (current) use of opiate analgesic: Secondary | ICD-10-CM | POA: Diagnosis not present

## 2018-08-01 DIAGNOSIS — Z96651 Presence of right artificial knee joint: Secondary | ICD-10-CM | POA: Diagnosis not present

## 2018-08-01 DIAGNOSIS — E785 Hyperlipidemia, unspecified: Secondary | ICD-10-CM | POA: Diagnosis not present

## 2018-08-01 DIAGNOSIS — M1711 Unilateral primary osteoarthritis, right knee: Secondary | ICD-10-CM | POA: Diagnosis not present

## 2018-08-01 DIAGNOSIS — K219 Gastro-esophageal reflux disease without esophagitis: Secondary | ICD-10-CM | POA: Diagnosis not present

## 2018-08-01 DIAGNOSIS — Z7902 Long term (current) use of antithrombotics/antiplatelets: Secondary | ICD-10-CM | POA: Diagnosis not present

## 2018-08-01 DIAGNOSIS — Z87891 Personal history of nicotine dependence: Secondary | ICD-10-CM | POA: Diagnosis not present

## 2018-08-01 DIAGNOSIS — G4733 Obstructive sleep apnea (adult) (pediatric): Secondary | ICD-10-CM | POA: Diagnosis not present

## 2018-08-01 DIAGNOSIS — Z79899 Other long term (current) drug therapy: Secondary | ICD-10-CM | POA: Diagnosis not present

## 2018-08-02 DIAGNOSIS — J449 Chronic obstructive pulmonary disease, unspecified: Secondary | ICD-10-CM | POA: Diagnosis not present

## 2018-08-02 DIAGNOSIS — Z79891 Long term (current) use of opiate analgesic: Secondary | ICD-10-CM | POA: Diagnosis not present

## 2018-08-02 DIAGNOSIS — G894 Chronic pain syndrome: Secondary | ICD-10-CM | POA: Diagnosis not present

## 2018-08-02 DIAGNOSIS — Z87891 Personal history of nicotine dependence: Secondary | ICD-10-CM | POA: Diagnosis not present

## 2018-08-02 DIAGNOSIS — Z96651 Presence of right artificial knee joint: Secondary | ICD-10-CM | POA: Diagnosis not present

## 2018-08-02 DIAGNOSIS — G4733 Obstructive sleep apnea (adult) (pediatric): Secondary | ICD-10-CM | POA: Diagnosis not present

## 2018-08-02 DIAGNOSIS — I10 Essential (primary) hypertension: Secondary | ICD-10-CM | POA: Diagnosis not present

## 2018-08-02 DIAGNOSIS — G20 Parkinson's disease: Secondary | ICD-10-CM | POA: Diagnosis not present

## 2018-08-02 DIAGNOSIS — K219 Gastro-esophageal reflux disease without esophagitis: Secondary | ICD-10-CM | POA: Diagnosis not present

## 2018-08-02 DIAGNOSIS — Z79899 Other long term (current) drug therapy: Secondary | ICD-10-CM | POA: Diagnosis not present

## 2018-08-02 DIAGNOSIS — E785 Hyperlipidemia, unspecified: Secondary | ICD-10-CM | POA: Diagnosis not present

## 2018-08-02 DIAGNOSIS — Z7902 Long term (current) use of antithrombotics/antiplatelets: Secondary | ICD-10-CM | POA: Diagnosis not present

## 2018-08-02 DIAGNOSIS — Z8673 Personal history of transient ischemic attack (TIA), and cerebral infarction without residual deficits: Secondary | ICD-10-CM | POA: Diagnosis not present

## 2018-08-02 DIAGNOSIS — R0602 Shortness of breath: Secondary | ICD-10-CM | POA: Diagnosis not present

## 2018-08-02 DIAGNOSIS — I2699 Other pulmonary embolism without acute cor pulmonale: Secondary | ICD-10-CM | POA: Diagnosis not present

## 2018-08-02 DIAGNOSIS — M1711 Unilateral primary osteoarthritis, right knee: Secondary | ICD-10-CM | POA: Diagnosis not present

## 2018-08-03 DIAGNOSIS — K219 Gastro-esophageal reflux disease without esophagitis: Secondary | ICD-10-CM | POA: Diagnosis not present

## 2018-08-03 DIAGNOSIS — Z7902 Long term (current) use of antithrombotics/antiplatelets: Secondary | ICD-10-CM | POA: Diagnosis not present

## 2018-08-03 DIAGNOSIS — G20 Parkinson's disease: Secondary | ICD-10-CM | POA: Diagnosis not present

## 2018-08-03 DIAGNOSIS — Z96651 Presence of right artificial knee joint: Secondary | ICD-10-CM | POA: Diagnosis not present

## 2018-08-03 DIAGNOSIS — J449 Chronic obstructive pulmonary disease, unspecified: Secondary | ICD-10-CM | POA: Diagnosis not present

## 2018-08-03 DIAGNOSIS — Z8673 Personal history of transient ischemic attack (TIA), and cerebral infarction without residual deficits: Secondary | ICD-10-CM | POA: Diagnosis not present

## 2018-08-03 DIAGNOSIS — G894 Chronic pain syndrome: Secondary | ICD-10-CM | POA: Diagnosis not present

## 2018-08-03 DIAGNOSIS — G4733 Obstructive sleep apnea (adult) (pediatric): Secondary | ICD-10-CM | POA: Diagnosis not present

## 2018-08-03 DIAGNOSIS — I10 Essential (primary) hypertension: Secondary | ICD-10-CM | POA: Diagnosis not present

## 2018-08-03 DIAGNOSIS — E785 Hyperlipidemia, unspecified: Secondary | ICD-10-CM | POA: Diagnosis not present

## 2018-08-03 DIAGNOSIS — M1711 Unilateral primary osteoarthritis, right knee: Secondary | ICD-10-CM | POA: Diagnosis not present

## 2018-08-03 DIAGNOSIS — Z79899 Other long term (current) drug therapy: Secondary | ICD-10-CM | POA: Diagnosis not present

## 2018-08-03 DIAGNOSIS — Z87891 Personal history of nicotine dependence: Secondary | ICD-10-CM | POA: Diagnosis not present

## 2018-08-03 DIAGNOSIS — Z79891 Long term (current) use of opiate analgesic: Secondary | ICD-10-CM | POA: Diagnosis not present

## 2018-08-04 DIAGNOSIS — Z471 Aftercare following joint replacement surgery: Secondary | ICD-10-CM | POA: Diagnosis not present

## 2018-08-04 DIAGNOSIS — G890 Central pain syndrome: Secondary | ICD-10-CM | POA: Diagnosis not present

## 2018-08-04 DIAGNOSIS — G20 Parkinson's disease: Secondary | ICD-10-CM | POA: Diagnosis not present

## 2018-08-04 DIAGNOSIS — I10 Essential (primary) hypertension: Secondary | ICD-10-CM | POA: Diagnosis not present

## 2018-08-05 DIAGNOSIS — R5082 Postprocedural fever: Secondary | ICD-10-CM | POA: Diagnosis not present

## 2018-08-05 DIAGNOSIS — R3129 Other microscopic hematuria: Secondary | ICD-10-CM | POA: Diagnosis not present

## 2018-08-05 DIAGNOSIS — M25461 Effusion, right knee: Secondary | ICD-10-CM | POA: Diagnosis not present

## 2018-08-05 DIAGNOSIS — G20 Parkinson's disease: Secondary | ICD-10-CM | POA: Diagnosis not present

## 2018-08-05 DIAGNOSIS — Z7902 Long term (current) use of antithrombotics/antiplatelets: Secondary | ICD-10-CM | POA: Diagnosis not present

## 2018-08-05 DIAGNOSIS — Z7982 Long term (current) use of aspirin: Secondary | ICD-10-CM | POA: Diagnosis not present

## 2018-08-05 DIAGNOSIS — Z8673 Personal history of transient ischemic attack (TIA), and cerebral infarction without residual deficits: Secondary | ICD-10-CM | POA: Diagnosis not present

## 2018-08-05 DIAGNOSIS — L089 Local infection of the skin and subcutaneous tissue, unspecified: Secondary | ICD-10-CM | POA: Diagnosis not present

## 2018-08-05 DIAGNOSIS — M25561 Pain in right knee: Secondary | ICD-10-CM | POA: Diagnosis not present

## 2018-08-05 DIAGNOSIS — K4020 Bilateral inguinal hernia, without obstruction or gangrene, not specified as recurrent: Secondary | ICD-10-CM | POA: Diagnosis not present

## 2018-08-05 DIAGNOSIS — I10 Essential (primary) hypertension: Secondary | ICD-10-CM | POA: Diagnosis not present

## 2018-08-05 DIAGNOSIS — N2889 Other specified disorders of kidney and ureter: Secondary | ICD-10-CM | POA: Diagnosis not present

## 2018-08-05 DIAGNOSIS — Z96651 Presence of right artificial knee joint: Secondary | ICD-10-CM | POA: Diagnosis not present

## 2018-08-05 DIAGNOSIS — M7989 Other specified soft tissue disorders: Secondary | ICD-10-CM | POA: Diagnosis not present

## 2018-08-05 DIAGNOSIS — Z85820 Personal history of malignant melanoma of skin: Secondary | ICD-10-CM | POA: Diagnosis not present

## 2018-08-05 DIAGNOSIS — J449 Chronic obstructive pulmonary disease, unspecified: Secondary | ICD-10-CM | POA: Diagnosis not present

## 2018-08-05 DIAGNOSIS — Z79899 Other long term (current) drug therapy: Secondary | ICD-10-CM | POA: Diagnosis not present

## 2018-08-05 DIAGNOSIS — Z888 Allergy status to other drugs, medicaments and biological substances status: Secondary | ICD-10-CM | POA: Diagnosis not present

## 2018-08-05 DIAGNOSIS — R935 Abnormal findings on diagnostic imaging of other abdominal regions, including retroperitoneum: Secondary | ICD-10-CM | POA: Diagnosis not present

## 2018-08-05 DIAGNOSIS — Z471 Aftercare following joint replacement surgery: Secondary | ICD-10-CM | POA: Diagnosis not present

## 2018-08-05 DIAGNOSIS — R339 Retention of urine, unspecified: Secondary | ICD-10-CM | POA: Diagnosis not present

## 2018-08-08 DIAGNOSIS — I10 Essential (primary) hypertension: Secondary | ICD-10-CM | POA: Diagnosis not present

## 2018-08-08 DIAGNOSIS — G890 Central pain syndrome: Secondary | ICD-10-CM | POA: Diagnosis not present

## 2018-08-08 DIAGNOSIS — Z471 Aftercare following joint replacement surgery: Secondary | ICD-10-CM | POA: Diagnosis not present

## 2018-08-08 DIAGNOSIS — G20 Parkinson's disease: Secondary | ICD-10-CM | POA: Diagnosis not present

## 2018-08-09 DIAGNOSIS — Z96651 Presence of right artificial knee joint: Secondary | ICD-10-CM | POA: Diagnosis not present

## 2018-08-10 DIAGNOSIS — G890 Central pain syndrome: Secondary | ICD-10-CM | POA: Diagnosis not present

## 2018-08-10 DIAGNOSIS — G20 Parkinson's disease: Secondary | ICD-10-CM | POA: Diagnosis not present

## 2018-08-10 DIAGNOSIS — I10 Essential (primary) hypertension: Secondary | ICD-10-CM | POA: Diagnosis not present

## 2018-08-10 DIAGNOSIS — Z471 Aftercare following joint replacement surgery: Secondary | ICD-10-CM | POA: Diagnosis not present

## 2018-08-11 DIAGNOSIS — R3129 Other microscopic hematuria: Secondary | ICD-10-CM | POA: Diagnosis not present

## 2018-08-11 DIAGNOSIS — Z87898 Personal history of other specified conditions: Secondary | ICD-10-CM | POA: Diagnosis not present

## 2018-08-11 DIAGNOSIS — R339 Retention of urine, unspecified: Secondary | ICD-10-CM | POA: Diagnosis not present

## 2018-08-11 DIAGNOSIS — N138 Other obstructive and reflux uropathy: Secondary | ICD-10-CM | POA: Diagnosis not present

## 2018-08-12 DIAGNOSIS — Z471 Aftercare following joint replacement surgery: Secondary | ICD-10-CM | POA: Diagnosis not present

## 2018-08-12 DIAGNOSIS — I10 Essential (primary) hypertension: Secondary | ICD-10-CM | POA: Diagnosis not present

## 2018-08-12 DIAGNOSIS — G890 Central pain syndrome: Secondary | ICD-10-CM | POA: Diagnosis not present

## 2018-08-12 DIAGNOSIS — G20 Parkinson's disease: Secondary | ICD-10-CM | POA: Diagnosis not present

## 2018-08-17 DIAGNOSIS — I10 Essential (primary) hypertension: Secondary | ICD-10-CM | POA: Diagnosis not present

## 2018-08-17 DIAGNOSIS — Z471 Aftercare following joint replacement surgery: Secondary | ICD-10-CM | POA: Diagnosis not present

## 2018-08-17 DIAGNOSIS — G890 Central pain syndrome: Secondary | ICD-10-CM | POA: Diagnosis not present

## 2018-08-17 DIAGNOSIS — G20 Parkinson's disease: Secondary | ICD-10-CM | POA: Diagnosis not present

## 2018-08-18 DIAGNOSIS — I129 Hypertensive chronic kidney disease with stage 1 through stage 4 chronic kidney disease, or unspecified chronic kidney disease: Secondary | ICD-10-CM | POA: Diagnosis not present

## 2018-08-18 DIAGNOSIS — N390 Urinary tract infection, site not specified: Secondary | ICD-10-CM | POA: Diagnosis not present

## 2018-08-18 DIAGNOSIS — K219 Gastro-esophageal reflux disease without esophagitis: Secondary | ICD-10-CM | POA: Diagnosis not present

## 2018-08-18 DIAGNOSIS — M25561 Pain in right knee: Secondary | ICD-10-CM | POA: Diagnosis not present

## 2018-08-18 DIAGNOSIS — I959 Hypotension, unspecified: Secondary | ICD-10-CM | POA: Diagnosis not present

## 2018-08-18 DIAGNOSIS — N3001 Acute cystitis with hematuria: Secondary | ICD-10-CM | POA: Diagnosis not present

## 2018-08-18 DIAGNOSIS — R52 Pain, unspecified: Secondary | ICD-10-CM | POA: Diagnosis not present

## 2018-08-18 DIAGNOSIS — E861 Hypovolemia: Secondary | ICD-10-CM | POA: Diagnosis not present

## 2018-08-18 DIAGNOSIS — A4150 Gram-negative sepsis, unspecified: Secondary | ICD-10-CM | POA: Diagnosis not present

## 2018-08-18 DIAGNOSIS — E785 Hyperlipidemia, unspecified: Secondary | ICD-10-CM | POA: Diagnosis not present

## 2018-08-18 DIAGNOSIS — D649 Anemia, unspecified: Secondary | ICD-10-CM | POA: Diagnosis not present

## 2018-08-18 DIAGNOSIS — R319 Hematuria, unspecified: Secondary | ICD-10-CM | POA: Diagnosis not present

## 2018-08-18 DIAGNOSIS — G4733 Obstructive sleep apnea (adult) (pediatric): Secondary | ICD-10-CM | POA: Diagnosis not present

## 2018-08-18 DIAGNOSIS — B9620 Unspecified Escherichia coli [E. coli] as the cause of diseases classified elsewhere: Secondary | ICD-10-CM | POA: Diagnosis not present

## 2018-08-18 DIAGNOSIS — I9589 Other hypotension: Secondary | ICD-10-CM | POA: Diagnosis not present

## 2018-08-18 DIAGNOSIS — M7989 Other specified soft tissue disorders: Secondary | ICD-10-CM | POA: Diagnosis not present

## 2018-08-18 DIAGNOSIS — Z7982 Long term (current) use of aspirin: Secondary | ICD-10-CM | POA: Diagnosis not present

## 2018-08-18 DIAGNOSIS — J189 Pneumonia, unspecified organism: Secondary | ICD-10-CM | POA: Diagnosis not present

## 2018-08-18 DIAGNOSIS — B9689 Other specified bacterial agents as the cause of diseases classified elsewhere: Secondary | ICD-10-CM | POA: Diagnosis not present

## 2018-08-18 DIAGNOSIS — Z8673 Personal history of transient ischemic attack (TIA), and cerebral infarction without residual deficits: Secondary | ICD-10-CM | POA: Diagnosis not present

## 2018-08-18 DIAGNOSIS — R05 Cough: Secondary | ICD-10-CM | POA: Diagnosis not present

## 2018-08-18 DIAGNOSIS — N183 Chronic kidney disease, stage 3 (moderate): Secondary | ICD-10-CM | POA: Diagnosis not present

## 2018-08-18 DIAGNOSIS — Z96651 Presence of right artificial knee joint: Secondary | ICD-10-CM | POA: Diagnosis not present

## 2018-08-18 DIAGNOSIS — K922 Gastrointestinal hemorrhage, unspecified: Secondary | ICD-10-CM | POA: Diagnosis not present

## 2018-08-18 DIAGNOSIS — N179 Acute kidney failure, unspecified: Secondary | ICD-10-CM | POA: Diagnosis not present

## 2018-08-18 DIAGNOSIS — A419 Sepsis, unspecified organism: Secondary | ICD-10-CM | POA: Diagnosis not present

## 2018-08-18 DIAGNOSIS — R338 Other retention of urine: Secondary | ICD-10-CM | POA: Diagnosis not present

## 2018-08-18 DIAGNOSIS — R6521 Severe sepsis with septic shock: Secondary | ICD-10-CM | POA: Diagnosis not present

## 2018-08-18 DIAGNOSIS — J440 Chronic obstructive pulmonary disease with acute lower respiratory infection: Secondary | ICD-10-CM | POA: Diagnosis not present

## 2018-08-18 DIAGNOSIS — Z79899 Other long term (current) drug therapy: Secondary | ICD-10-CM | POA: Diagnosis not present

## 2018-08-18 DIAGNOSIS — R600 Localized edema: Secondary | ICD-10-CM | POA: Diagnosis not present

## 2018-08-18 DIAGNOSIS — Z7902 Long term (current) use of antithrombotics/antiplatelets: Secondary | ICD-10-CM | POA: Diagnosis not present

## 2018-08-18 DIAGNOSIS — J449 Chronic obstructive pulmonary disease, unspecified: Secondary | ICD-10-CM

## 2018-08-19 ENCOUNTER — Inpatient Hospital Stay (HOSPITAL_COMMUNITY)
Admission: AD | Admit: 2018-08-19 | Discharge: 2018-08-27 | DRG: 871 | Disposition: A | Discharge status: Home / Self Care | Payer: Medicare Other | Source: Other Acute Inpatient Hospital | Attending: Internal Medicine | Admitting: Internal Medicine

## 2018-08-19 ENCOUNTER — Other Ambulatory Visit: Payer: Self-pay

## 2018-08-19 ENCOUNTER — Inpatient Hospital Stay (HOSPITAL_COMMUNITY): Payer: Medicare Other

## 2018-08-19 ENCOUNTER — Encounter (HOSPITAL_COMMUNITY): Payer: Self-pay | Admitting: *Deleted

## 2018-08-19 DIAGNOSIS — K922 Gastrointestinal hemorrhage, unspecified: Secondary | ICD-10-CM | POA: Diagnosis present

## 2018-08-19 DIAGNOSIS — E669 Obesity, unspecified: Secondary | ICD-10-CM | POA: Diagnosis not present

## 2018-08-19 DIAGNOSIS — N401 Enlarged prostate with lower urinary tract symptoms: Secondary | ICD-10-CM | POA: Diagnosis not present

## 2018-08-19 DIAGNOSIS — K625 Hemorrhage of anus and rectum: Secondary | ICD-10-CM | POA: Diagnosis not present

## 2018-08-19 DIAGNOSIS — I129 Hypertensive chronic kidney disease with stage 1 through stage 4 chronic kidney disease, or unspecified chronic kidney disease: Secondary | ICD-10-CM | POA: Diagnosis not present

## 2018-08-19 DIAGNOSIS — Z79899 Other long term (current) drug therapy: Secondary | ICD-10-CM

## 2018-08-19 DIAGNOSIS — N183 Chronic kidney disease, stage 3 (moderate): Secondary | ICD-10-CM | POA: Diagnosis present

## 2018-08-19 DIAGNOSIS — E1169 Type 2 diabetes mellitus with other specified complication: Secondary | ICD-10-CM | POA: Diagnosis not present

## 2018-08-19 DIAGNOSIS — K219 Gastro-esophageal reflux disease without esophagitis: Secondary | ICD-10-CM | POA: Diagnosis present

## 2018-08-19 DIAGNOSIS — G4733 Obstructive sleep apnea (adult) (pediatric): Secondary | ICD-10-CM | POA: Diagnosis present

## 2018-08-19 DIAGNOSIS — N390 Urinary tract infection, site not specified: Secondary | ICD-10-CM | POA: Diagnosis not present

## 2018-08-19 DIAGNOSIS — D509 Iron deficiency anemia, unspecified: Secondary | ICD-10-CM | POA: Diagnosis not present

## 2018-08-19 DIAGNOSIS — J449 Chronic obstructive pulmonary disease, unspecified: Secondary | ICD-10-CM | POA: Diagnosis not present

## 2018-08-19 DIAGNOSIS — E785 Hyperlipidemia, unspecified: Secondary | ICD-10-CM | POA: Diagnosis not present

## 2018-08-19 DIAGNOSIS — J440 Chronic obstructive pulmonary disease with acute lower respiratory infection: Secondary | ICD-10-CM | POA: Diagnosis not present

## 2018-08-19 DIAGNOSIS — G8929 Other chronic pain: Secondary | ICD-10-CM | POA: Diagnosis not present

## 2018-08-19 DIAGNOSIS — Z87891 Personal history of nicotine dependence: Secondary | ICD-10-CM

## 2018-08-19 DIAGNOSIS — Z7901 Long term (current) use of anticoagulants: Secondary | ICD-10-CM

## 2018-08-19 DIAGNOSIS — N179 Acute kidney failure, unspecified: Secondary | ICD-10-CM | POA: Diagnosis present

## 2018-08-19 DIAGNOSIS — Z86711 Personal history of pulmonary embolism: Secondary | ICD-10-CM | POA: Diagnosis not present

## 2018-08-19 DIAGNOSIS — D649 Anemia, unspecified: Secondary | ICD-10-CM | POA: Diagnosis not present

## 2018-08-19 DIAGNOSIS — R338 Other retention of urine: Secondary | ICD-10-CM | POA: Diagnosis not present

## 2018-08-19 DIAGNOSIS — B9620 Unspecified Escherichia coli [E. coli] as the cause of diseases classified elsewhere: Secondary | ICD-10-CM

## 2018-08-19 DIAGNOSIS — M797 Fibromyalgia: Secondary | ICD-10-CM | POA: Diagnosis present

## 2018-08-19 DIAGNOSIS — R059 Cough, unspecified: Secondary | ICD-10-CM

## 2018-08-19 DIAGNOSIS — Y846 Urinary catheterization as the cause of abnormal reaction of the patient, or of later complication, without mention of misadventure at the time of the procedure: Secondary | ICD-10-CM | POA: Diagnosis present

## 2018-08-19 DIAGNOSIS — R7881 Bacteremia: Secondary | ICD-10-CM | POA: Diagnosis not present

## 2018-08-19 DIAGNOSIS — Z8719 Personal history of other diseases of the digestive system: Secondary | ICD-10-CM | POA: Diagnosis not present

## 2018-08-19 DIAGNOSIS — N3001 Acute cystitis with hematuria: Secondary | ICD-10-CM | POA: Diagnosis present

## 2018-08-19 DIAGNOSIS — I959 Hypotension, unspecified: Secondary | ICD-10-CM | POA: Diagnosis not present

## 2018-08-19 DIAGNOSIS — Z7951 Long term (current) use of inhaled steroids: Secondary | ICD-10-CM

## 2018-08-19 DIAGNOSIS — J189 Pneumonia, unspecified organism: Secondary | ICD-10-CM | POA: Diagnosis present

## 2018-08-19 DIAGNOSIS — D508 Other iron deficiency anemias: Secondary | ICD-10-CM | POA: Diagnosis not present

## 2018-08-19 DIAGNOSIS — Z9981 Dependence on supplemental oxygen: Secondary | ICD-10-CM

## 2018-08-19 DIAGNOSIS — E1159 Type 2 diabetes mellitus with other circulatory complications: Secondary | ICD-10-CM | POA: Diagnosis not present

## 2018-08-19 DIAGNOSIS — A4151 Sepsis due to Escherichia coli [E. coli]: Secondary | ICD-10-CM | POA: Diagnosis not present

## 2018-08-19 DIAGNOSIS — T83511A Infection and inflammatory reaction due to indwelling urethral catheter, initial encounter: Secondary | ICD-10-CM | POA: Diagnosis not present

## 2018-08-19 DIAGNOSIS — K5909 Other constipation: Secondary | ICD-10-CM | POA: Diagnosis present

## 2018-08-19 DIAGNOSIS — Z8673 Personal history of transient ischemic attack (TIA), and cerebral infarction without residual deficits: Secondary | ICD-10-CM

## 2018-08-19 DIAGNOSIS — Z7902 Long term (current) use of antithrombotics/antiplatelets: Secondary | ICD-10-CM | POA: Diagnosis not present

## 2018-08-19 DIAGNOSIS — Z1619 Resistance to other specified beta lactam antibiotics: Secondary | ICD-10-CM | POA: Diagnosis present

## 2018-08-19 DIAGNOSIS — R05 Cough: Secondary | ICD-10-CM

## 2018-08-19 DIAGNOSIS — Z888 Allergy status to other drugs, medicaments and biological substances status: Secondary | ICD-10-CM

## 2018-08-19 DIAGNOSIS — E118 Type 2 diabetes mellitus with unspecified complications: Secondary | ICD-10-CM | POA: Diagnosis not present

## 2018-08-19 DIAGNOSIS — Z6834 Body mass index (BMI) 34.0-34.9, adult: Secondary | ICD-10-CM

## 2018-08-19 DIAGNOSIS — Z85828 Personal history of other malignant neoplasm of skin: Secondary | ICD-10-CM

## 2018-08-19 DIAGNOSIS — A419 Sepsis, unspecified organism: Secondary | ICD-10-CM | POA: Diagnosis not present

## 2018-08-19 DIAGNOSIS — Z96651 Presence of right artificial knee joint: Secondary | ICD-10-CM | POA: Diagnosis present

## 2018-08-19 DIAGNOSIS — R52 Pain, unspecified: Secondary | ICD-10-CM | POA: Diagnosis not present

## 2018-08-19 DIAGNOSIS — K921 Melena: Secondary | ICD-10-CM | POA: Diagnosis not present

## 2018-08-19 LAB — COMPREHENSIVE METABOLIC PANEL
ALBUMIN: 3 g/dL — AB (ref 3.5–5.0)
ALK PHOS: 52 U/L (ref 38–126)
ALT: 15 U/L (ref 0–44)
AST: 18 U/L (ref 15–41)
Anion gap: 7 (ref 5–15)
BUN: 27 mg/dL — AB (ref 8–23)
CALCIUM: 8.1 mg/dL — AB (ref 8.9–10.3)
CO2: 23 mmol/L (ref 22–32)
CREATININE: 1.52 mg/dL — AB (ref 0.61–1.24)
Chloride: 109 mmol/L (ref 98–111)
GFR calc Af Amer: 53 mL/min — ABNORMAL LOW (ref >60)
GFR calc non Af Amer: 45 mL/min — ABNORMAL LOW (ref >60)
GLUCOSE: 105 mg/dL — AB (ref 70–99)
Potassium: 3.8 mmol/L (ref 3.5–5.1)
SODIUM: 139 mmol/L (ref 135–145)
Total Bilirubin: 0.8 mg/dL (ref 0.3–1.2)
Total Protein: 6.1 g/dL — ABNORMAL LOW (ref 6.5–8.1)

## 2018-08-19 LAB — CBC
HCT: 25.6 % — ABNORMAL LOW (ref 39.0–52.0)
Hemoglobin: 7.8 g/dL — ABNORMAL LOW (ref 13.0–17.0)
MCH: 29.1 pg (ref 26.0–34.0)
MCHC: 30.5 g/dL (ref 30.0–36.0)
MCV: 95.5 fL (ref 80.0–100.0)
NRBC: 0 % (ref 0.0–0.2)
Platelets: 190 10*3/uL (ref 150–400)
RBC: 2.68 MIL/uL — ABNORMAL LOW (ref 4.22–5.81)
RDW: 15.1 % (ref 11.5–15.5)
WBC: 7.6 10*3/uL (ref 4.0–10.5)

## 2018-08-19 LAB — MRSA PCR SCREENING: MRSA by PCR: NEGATIVE

## 2018-08-19 LAB — PREPARE RBC (CROSSMATCH)

## 2018-08-19 LAB — ABO/RH: ABO/RH(D): O POS

## 2018-08-19 MED ORDER — SODIUM CHLORIDE 0.9% IV SOLUTION
Freq: Once | INTRAVENOUS | Status: AC
  Administered 2018-08-19: 22:00:00 via INTRAVENOUS

## 2018-08-19 MED ORDER — SODIUM CHLORIDE 0.9 % IV SOLN
500.0000 mg | INTRAVENOUS | Status: DC
  Administered 2018-08-19: 500 mg via INTRAVENOUS
  Filled 2018-08-19: qty 500

## 2018-08-19 MED ORDER — ONDANSETRON HCL 4 MG PO TABS: 4.0000 mg | ORAL_TABLET | Freq: Four times a day (QID) | ORAL | Status: DC | PRN

## 2018-08-19 MED ORDER — TRAMADOL HCL 50 MG PO TABS: 50.0000 mg | ORAL_TABLET | Freq: Four times a day (QID) | ORAL | Status: DC | PRN

## 2018-08-19 MED ORDER — PANTOPRAZOLE SODIUM 40 MG IV SOLR
40.0000 mg | Freq: Two times a day (BID) | INTRAVENOUS | Status: DC
  Administered 2018-08-19 – 2018-08-25 (×13): 40 mg via INTRAVENOUS
  Filled 2018-08-19 (×13): qty 40

## 2018-08-19 MED ORDER — SODIUM CHLORIDE 0.9% FLUSH
3.0000 mL | Freq: Two times a day (BID) | INTRAVENOUS | Status: DC
  Administered 2018-08-19 – 2018-08-27 (×16): 3 mL via INTRAVENOUS

## 2018-08-19 MED ORDER — ACETAMINOPHEN 325 MG PO TABS
650.0000 mg | ORAL_TABLET | Freq: Four times a day (QID) | ORAL | Status: DC | PRN
  Administered 2018-08-19 – 2018-08-26 (×4): 650 mg via ORAL
  Filled 2018-08-19 (×4): qty 2

## 2018-08-19 MED ORDER — ACETAMINOPHEN 650 MG RE SUPP: 650.0000 mg | Freq: Four times a day (QID) | RECTAL | Status: DC | PRN

## 2018-08-19 MED ORDER — SODIUM CHLORIDE 0.9 % IV SOLN
INTRAVENOUS | Status: DC
  Administered 2018-08-19 – 2018-08-21 (×2): via INTRAVENOUS

## 2018-08-19 MED ORDER — ONDANSETRON HCL 4 MG/2ML IJ SOLN: 4.0000 mg | Freq: Four times a day (QID) | INTRAMUSCULAR | Status: DC | PRN

## 2018-08-19 MED ORDER — SODIUM CHLORIDE 0.9 % IV SOLN
1.0000 g | INTRAVENOUS | Status: DC
  Administered 2018-08-19: 1 g via INTRAVENOUS
  Filled 2018-08-19: qty 10
  Filled 2018-08-19: qty 1

## 2018-08-19 NOTE — H&P (Addendum)
History and Physical  Luis Bryan UTM:[id number] DOB: 08/06/1951 DOA: 08/19/2018  PCP: Maryella Shivers, MD Patient coming from: Transfer from [facility]   I have personally briefly reviewed patient's old medical records in [facility]   Chief Complaint: blood in the stool.   HPI: Luis Bryan is a 67 y.o. male with past medical history significant for hypertension, BPH, remote provoked PE, OSA not on CPAP, COPD, chronic kidney disease stage III, right TKR 11-04 at [facility], , complicated by urinary retention needing a Foley catheter. He follow-up with his urologist and the catheter was removed. He presents to [facility] on 11/21 2019 with dysuria, fever, chills. Evaluation at [facility] consistent with sepsis due to urinary tract infection and left lingular pneumonia. Patient was started on Levaquin at [place]. The morning of transfer the patient was feeling better he has been able to urinate, but he developed bright red blood per rectum. Patient on presentation at prior facility his systolic blood pressure was in the 80s.  Patient reports a history of GI bleed for the last 10 years, with intermittent episodes. He has had evaluation in the past, with endoscopy on colonoscopy  unrevealing the source of bleeding. He can have months without experiencing any GI bleed. He relates, since after the surgery on November 4 he has had 3 episodes of bloody stool. He notices that these episodes started after he was started on aspirin. He also takes Plavix. He is started to have bloody bowel movement the morning of transfer. He reports a small amount, less than the teaspoon.  Patient also reports black stool, after the surgery. Of note, patient is  currently taking iron. He denies abdominal pain, chest pain, shortness of breath. He does report a productive cough.   Blood culture from [place]. Urine culture from [place]; E coli   Review of  Systems: All systems reviewed and apart from history of presenting illness, are negative.  Past Medical History:  Diagnosis Date  . Arthritis   . COPD (chronic obstructive pulmonary disease) ([place])   . H/O: stroke   . Skin cancer     Social History:  reports that he quit smoking about 39 years ago. His smoking use included cigarettes. He has a 5.00 pack-year smoking history. He does not have any smokeless tobacco history on file. His alcohol and drug histories are not on file.    Allergies  Allergen Reactions  . Levetiracetam Itching and Anxiety  . Cymbalta [Duloxetine Hcl]   . Lyrica [Pregabalin]   . Tamsulosin Itching    Dizziness Dizziness     Family History; Dad; Hear problems. Mother dies leukemia. Sister; stomach cancer.   Prior to Admission medications   Medication Sig Start Date End Date Taking? Authorizing Provider  albuterol-ipratropium (COMBIVENT) 18-103 MCG/ACT inhaler Inhale 2 puffs into the lungs every 4 (four) hours.    [provider]  DEXILANT 60 MG capsule Take 60 mg by mouth daily.  06/11/15   [provider]  fenofibrate 160 MG tablet Take 160 mg by mouth.  07/22/15   [provider]  gabapentin (NEURONTIN) 600 MG tablet Take 1,200 mg by mouth 3 (three) times daily.    [provider]  metoprolol succinate (TOPROL-XL) 25 MG 24 hr tablet Take 25 mg by mouth.    [provider]  mometasone-formoterol (DULERA) 100-5 MCG/ACT AERO Take 2 puffs first thing in am and then another 2 puffs about 12 hours later.  10/14/15   Tanda Rockers, MD  OXYGEN Inhale 2 L/min into the lungs at bedtime.    [provider]  primidone (MYSOLINE) 50 MG tablet Take 100 mg by mouth daily.     [provider]  ranitidine (ZANTAC) 150 MG tablet Take 150 mg by mouth daily.  07/16/15   [provider]  Respiratory Therapy Supplies (FLUTTER) DEVI AS DIRECTED 08/09/15   Tanda Rockers, MD  Rivaroxaban (XARELTO) 15 MG TABS  tablet Take 1 tablet (15 mg total) by mouth 2 (two) times daily with a meal. 09/10/15   Tanda Rockers, MD  rosuvastatin (CRESTOR) 10 MG tablet Take 10 mg by mouth.    [provider]  traMADol (ULTRAM) 50 MG tablet TAKE ONE TO TWO TABLETS BY MOUTH EVERY 4 HOURS AS NEEDED FOR COUGH OR PAIN 11/21/15   Tanda Rockers, MD  valsartan-hydrochlorothiazide (DIOVAN-HCT) 160-25 MG tablet Take 1 tablet by mouth daily.    [provider]  warfarin (COUMADIN) 1 MG tablet As directed 10/10/15   [provider]  warfarin (COUMADIN) 5 MG tablet As directed 10/10/15   [provider]   Physical Exam: Vitals:   08/19/18 1731  BP: (!) 113/58  Pulse: 82  Resp: 12  Temp: 98 F (36.7 C)  TempSrc: Oral  SpO2: 92%  Weight: 120.8 kg  Height: 6\' 2"  (1.88 m)     General exam: Moderately built and nourished patient, lying comfortably supine on the gurney in no obvious distress.  Head, eyes and ENT: Nontraumatic and normocephalic. Pupils equally reacting to light and accommodation. Oral mucosa moist.  Neck: Supple. No JVD, carotid bruit or thyromegaly.  Lymphatics: No lymphadenopathy.  Respiratory system: crackles right side, decreased breath sounds. . No increased work of breathing.  Cardiovascular system: S1 and S2 heard, RRR. No JVD, murmurs, gallops, clicks or pedal edema.  Gastrointestinal system: Abdomen is nondistended, soft and nontender. Normal bowel sounds heard. No organomegaly or masses appreciated.  Central nervous system: Alert and oriented. No focal neurological deficits.  Extremities: Symmetric 5 x 5 power. Peripheral pulses symmetrically felt.   Skin: No rashes or acute findings.  Musculoskeletal system: Negative exam. Left Knee post, surgery healing, dressing.   Psychiatry: Pleasant and cooperative.   Labs on Admission:  Basic Metabolic Panel: No results for input(s): NA, K, CL, CO2, GLUCOSE, BUN, CREATININE, CALCIUM, MG, PHOS in the last 168  hours. Liver Function Tests: No results for input(s): AST, ALT, ALKPHOS, BILITOT, PROT, ALBUMIN in the last 168 hours. No results for input(s): LIPASE, AMYLASE in the last 168 hours. No results for input(s): AMMONIA in the last 168 hours. CBC: Recent Labs  Lab 08/19/18 1821  WBC 7.6  HGB 7.8*  HCT 25.6*  MCV 95.5  PLT 190   Cardiac Enzymes: No results for input(s): CKTOTAL, CKMB, CKMBINDEX, TROPONINI in the last 168 hours.  BNP (last 3 results) No results for input(s): PROBNP in the last 8760 hours. CBG: No results for input(s): GLUCAP in the last 168 hours.  Radiological Exams on Admission: No results found.  EKG: will order it.   Assessment/Plan Active Problems:   COPD GOLD 0    Morbid obesity (HCC)   GI bleed   PNA (pneumonia)   Acute lower UTI  1-Sepsis; Gram negative bacteremia. UTI he presented with hypotension, leukocytosis, at prior facility. His BP improved. Sepsis secondary to UTI and PNA.  He was treated with levaquin started 11-21. - I will start ceftriaxone.  -will  need follow up blood cultures and urine culture from [place].  -will need to repeat Blood cultures in 24 to 48 hours.   2-UTI; with hematuria.  Related to prior foley catheter.  Monitor urine out put. Bladder scan.  Check UA and urine culture.  Start ceftriaxone.   3-Acute GI bleed. History of same.  Presents with blood per rectum, black stool.  IV fluids. Type and scren. Repeat hb blood transfusion as needed.  Will need to inform [facility] of patient admission in am.  IV protonix.    4-AKI on CKD stage III; per report cr one year ago was at 1.3.  Check renal function.  Cr at [place] was 2.2.--1.9   5-PNA, lingula. Start ceftriaxone and Zithromax.  check Chest xray.   6-urinary retention; he was started on bethanechol 25 mg TID, Proscar 5 mg daily, doxazosin. Will await for med rec.  7-history of stroke. Hold plavix.  8-recent Right knee sx; hold aspirin.   DVT Prophylaxis:  SCD> Code Status: Full code.  Family Communication: No family at bedside.  Disposition Plan: to be determine.   Time spent: 70 minutes.     It is my clinical opinion that admission to INPATIENT is reasonable and necessary in this 67 y.o. male . presenting with symptoms of  UTI, , concerning for  Sepsis. Also GI bleed concern for acute blood loss. PNA.  Marland Kitchen in the context of PMH including: he's had stroke on Plavix, COPD, and recent knee surgery . with pertinent positives on physical exam including: decreased breath sounds on the right side, and crackles. . Workup and treatment include ; IV antibiotics and monitor hemoglobin blood transfusion as needed  Given the aforementioned, the predictability of an adverse outcome is felt to be significant. I expect that the patient will require at least 2 midnights in the hospital to treat this condition.  Elmarie Shiley MD [facility] Pager [phone]  If 7PM-7AM, please contact night-coverage [email or web address] Password [facility]  08/19/2018, 6:38 PM

## 2018-08-20 DIAGNOSIS — D649 Anemia, unspecified: Secondary | ICD-10-CM

## 2018-08-20 DIAGNOSIS — K921 Melena: Secondary | ICD-10-CM

## 2018-08-20 LAB — URINALYSIS, ROUTINE W REFLEX MICROSCOPIC
BILIRUBIN URINE: NEGATIVE
Bacteria, UA: NONE SEEN
GLUCOSE, UA: NEGATIVE mg/dL
Ketones, ur: NEGATIVE mg/dL
NITRITE: NEGATIVE
PH: 5 (ref 5.0–8.0)
Protein, ur: 30 mg/dL — AB
Specific Gravity, Urine: 1.02 (ref 1.005–1.030)
WBC, UA: >50 WBC/hpf — ABNORMAL HIGH (ref 0–5)

## 2018-08-20 LAB — RETICULOCYTES
IMMATURE RETIC FRACT: 16.1 % — AB (ref 2.3–15.9)
RBC.: 3.24 MIL/uL — AB (ref 4.22–5.81)
RETIC COUNT ABSOLUTE: 55.4 10*3/uL (ref 19.0–186.0)
Retic Ct Pct: 1.7 % (ref 0.4–3.1)

## 2018-08-20 LAB — BASIC METABOLIC PANEL
Anion gap: 6 (ref 5–15)
BUN: 17 mg/dL (ref 8–23)
CHLORIDE: 108 mmol/L (ref 98–111)
CO2: 24 mmol/L (ref 22–32)
CREATININE: 1.09 mg/dL (ref 0.61–1.24)
Calcium: 8.4 mg/dL — ABNORMAL LOW (ref 8.9–10.3)
GFR calc Af Amer: >60 mL/min (ref >60)
GFR calc non Af Amer: >60 mL/min (ref >60)
Glucose, Bld: 116 mg/dL — ABNORMAL HIGH (ref 70–99)
POTASSIUM: 3.7 mmol/L (ref 3.5–5.1)
Sodium: 138 mmol/L (ref 135–145)

## 2018-08-20 LAB — TYPE AND SCREEN
ABO/RH(D): O POS
ANTIBODY SCREEN: NEGATIVE
Unit division: 0

## 2018-08-20 LAB — IRON AND TIBC
IRON: 25 ug/dL — AB (ref 45–182)
Saturation Ratios: 9 % — ABNORMAL LOW (ref 17.9–39.5)
TIBC: 264 ug/dL (ref 250–450)
UIBC: 239 ug/dL

## 2018-08-20 LAB — HIV ANTIBODY (ROUTINE TESTING W REFLEX): HIV SCREEN 4TH GENERATION: NONREACTIVE

## 2018-08-20 LAB — FOLATE: FOLATE: 9.5 ng/mL (ref >5.9)

## 2018-08-20 LAB — CBC
HEMATOCRIT: 28 % — AB (ref 39.0–52.0)
HEMOGLOBIN: 8.5 g/dL — AB (ref 13.0–17.0)
MCH: 28.7 pg (ref 26.0–34.0)
MCHC: 30.4 g/dL (ref 30.0–36.0)
MCV: 94.6 fL (ref 80.0–100.0)
Platelets: 198 10*3/uL (ref 150–400)
RBC: 2.96 MIL/uL — AB (ref 4.22–5.81)
RDW: 14.8 % (ref 11.5–15.5)
WBC: 5.5 10*3/uL (ref 4.0–10.5)
nRBC: 0 % (ref 0.0–0.2)

## 2018-08-20 LAB — BPAM RBC
BLOOD PRODUCT EXPIRATION DATE: 201912222359
ISSUE DATE / TIME: 201911222222
Unit Type and Rh: 5100

## 2018-08-20 LAB — HEMOGLOBIN AND HEMATOCRIT, BLOOD
HCT: 26.1 % — ABNORMAL LOW (ref 39.0–52.0)
Hemoglobin: 7.9 g/dL — ABNORMAL LOW (ref 13.0–17.0)

## 2018-08-20 LAB — FERRITIN: Ferritin: 245 ng/mL (ref 24–336)

## 2018-08-20 LAB — VITAMIN B12: Vitamin B-12: 194 pg/mL (ref 180–914)

## 2018-08-20 MED ORDER — METOPROLOL TARTRATE 25 MG PO TABS
25.0000 mg | ORAL_TABLET | Freq: Every day | ORAL | Status: DC
  Administered 2018-08-20 – 2018-08-26 (×7): 25 mg via ORAL
  Filled 2018-08-20 (×7): qty 1

## 2018-08-20 MED ORDER — MORPHINE SULFATE ER 15 MG PO TBCR
15.0000 mg | EXTENDED_RELEASE_TABLET | Freq: Two times a day (BID) | ORAL | Status: DC
  Administered 2018-08-20 – 2018-08-21 (×3): 15 mg via ORAL
  Filled 2018-08-20 (×3): qty 1

## 2018-08-20 MED ORDER — GABAPENTIN 600 MG PO TABS: 600.0000 mg | ORAL_TABLET | Freq: Three times a day (TID) | ORAL | Status: DC

## 2018-08-20 MED ORDER — GABAPENTIN 400 MG PO CAPS
1200.0000 mg | ORAL_CAPSULE | Freq: Three times a day (TID) | ORAL | Status: DC
  Administered 2018-08-20 – 2018-08-27 (×22): 1200 mg via ORAL
  Filled 2018-08-20 (×23): qty 3

## 2018-08-20 MED ORDER — FINASTERIDE 5 MG PO TABS
5.0000 mg | ORAL_TABLET | Freq: Every day | ORAL | Status: DC
  Administered 2018-08-21 – 2018-08-27 (×7): 5 mg via ORAL
  Filled 2018-08-20 (×7): qty 1

## 2018-08-20 MED ORDER — BETHANECHOL CHLORIDE 25 MG PO TABS
25.0000 mg | ORAL_TABLET | Freq: Three times a day (TID) | ORAL | Status: DC
  Administered 2018-08-20 – 2018-08-27 (×22): 25 mg via ORAL
  Filled 2018-08-20 (×26): qty 1

## 2018-08-20 MED ORDER — SODIUM CHLORIDE 0.9 % IV SOLN
2.0000 g | INTRAVENOUS | Status: DC
  Administered 2018-08-20: 2 g via INTRAVENOUS
  Filled 2018-08-20: qty 2
  Filled 2018-08-20: qty 20

## 2018-08-20 MED ORDER — ROSUVASTATIN CALCIUM 10 MG PO TABS
10.0000 mg | ORAL_TABLET | Freq: Every day | ORAL | Status: DC
  Administered 2018-08-20 – 2018-08-26 (×7): 10 mg via ORAL
  Filled 2018-08-20 (×7): qty 1

## 2018-08-20 NOTE — Consult Note (Addendum)
Referring Provider: [facility]  Primary Care Physician:  Luis Shivers, MD Primary Gastroenterologist: unassigned    Luis Settle, MD [place]     Reason for Consultation:    Hematochezia, anemia   ASSESSMENT    1. 67 yo male with chronic, intermittent painless rectal bleeding with constipation but NEW normocytic anemia since ASA added to plavix .  Anemia seemed to evolve following recent TKR after which time the ASA was added. Stools black on iron.  He does have chronic intermittent low volume rectal bleeding (years) but says three colonoscopies and an upper endoscopy for the bleeding were negative (last colonoscopy ~ 5 years ago) .  -No rise in hemoglobin following a unit of blood yesterday.  Hemoglobin is still 7.9  -Hard to rule out an upper GI bleed since stools already black on iron. Unfortunately GI in [place] not open on weekend to get colonoscopy report.  -We could perform and EGD and if negative then consider repeat colonoscopy. -continue BID PPI   2. Sepsis/gram-negative bacteremia/UTI/pneumonia.  On antibiotics.   3. AKI on CKD3.   4. Urinary retention. Developed after knee surgery   5. Longstanding GERD, asymptomatic on treatment.   7. Chronic constipation with straining. Uses laxatives as needed.      Attending Physician Note   I have taken a history, examined the patient and reviewed the chart. I agree with the Advanced Practitioner's note, impression and recommendations. Recent TRK with constipation, more frequent small volume hematochezia and a new anemia. Chronic Plavix with ASA added recently. LOC and stool softener. Trend CBC. Anemia panel. EGD and colonoscopy at some point to further evaluate. The patient would like to further recover from his PNA, UTI and sepsis before proceeding with endoscopic evaluation which is reasonable. We will attempt to get his GI records from [facility] and reassess on Monday.   Luis Edward, MD [facility] [phone]      PLAN:    HPI: Luis Bryan is a 67 y.o. male  with multiple medical problems not limited to COPD, OSA, CKD 3 and remote PE.  He was transferred here yesterday from [facility] after presenting there with UTI /pneumonia / sepsis.  We have been asked to see the patient for rectal bleeding.  He reports chronic intermittent, painless rectal bleeding for years.  He has also been on Plavix for years.  Patient reports 3 colonoscopies and one upper endoscopy for evaluation of rectal bleeding in the absence of anemia.  Apparently nothing found on endoscopic work-up, last one approximately 5 years ago in [place].  He attributes the rectal bleeding to constipation with straining.  When he strains he also has blood in his urine .  Patient had a TKR on the 08/01/18 after which time aspirin was started.  Since then his hemoglobin has significantly declined.  He takes iron and does have black stools.  He has no abdominal pain.  No nausea vomiting or unexplained weight loss.  Patient gives a longstanding history of GERD but is asymptomatic on daily acid blockers.  Labs reviewed in [place].  Hemoglobin prior to knee surgery late October was 12.8.  On 08/05/2018 it was 9.5 down to 7.8 yesterday . MCV is normal.  Platelets normal at 190.  There is a CT scan of the abdomen and pelvis without IV contrast done 08/05/2018.  Findings include right hepatic hypodensities.  No focal bowel wall changes.    Past Medical History:  Diagnosis Date  . Arthritis   .  COPD (chronic obstructive pulmonary disease) ([place])   . H/O: stroke   . Skin cancer     Past Surgical History:  Procedure Laterality Date  . KNEE SURGERY      Prior to Admission medications   Medication Sig Start Date End Date Taking? Authorizing Provider  acetaminophen (TYLENOL) 500 MG tablet Take 1,000 mg by mouth every 6 (six) hours as needed for fever or headache.   Yes [provider]  aspirin 325 MG EC tablet Take 325 mg by  mouth every morning.    Yes [provider]  celecoxib (CELEBREX) 200 MG capsule Take 200 mg by mouth 2 (two) times daily.   Yes [provider]  clopidogrel (PLAVIX) 75 MG tablet Take 75 mg by mouth at bedtime.    Yes [provider]  cyclobenzaprine (FLEXERIL) 10 MG tablet Take 10 mg by mouth 3 (three) times daily as needed for muscle spasms.   Yes [provider]  esomeprazole (NEXIUM) 40 MG capsule Take 40 mg by mouth every morning.    Yes [provider]  fenofibrate 160 MG tablet Take 160 mg by mouth at bedtime.  07/22/15  Yes [provider]  furosemide (LASIX) 20 MG tablet Take 20 mg by mouth every morning.    Yes [provider]  gabapentin (NEURONTIN) 600 MG tablet Take 1,200 mg by mouth 3 (three) times daily.   Yes [provider]  iron polysaccharides (NIFEREX) 150 MG capsule Take 150 mg by mouth every morning.    Yes [provider]  metoprolol tartrate (LOPRESSOR) 25 MG tablet Take 25 mg by mouth at bedtime.   Yes [provider]  morphine (MS CONTIN) 15 MG 12 hr tablet Take 15 mg by mouth 3 (three) times daily.    Yes [provider]  primidone (MYSOLINE) 50 MG tablet Take 200 mg by mouth at bedtime.    Yes [provider]  quinapril (ACCUPRIL) 5 MG tablet Take 5 mg by mouth every morning.    Yes [provider]  rosuvastatin (CRESTOR) 10 MG tablet Take 10 mg by mouth at bedtime.    Yes [provider]  sulfamethoxazole-trimethoprim (BACTRIM,SEPTRA) 400-80 MG tablet Take 1 tablet by mouth 2 (two) times daily.   Yes [provider]  tiZANidine (ZANAFLEX) 2 MG tablet Take 2 mg by mouth every 6 (six) hours as needed for muscle spasms.   Yes [provider]  mometasone-formoterol (DULERA) 100-5 MCG/ACT AERO Take 2 puffs first thing in am and then another 2 puffs about 12 hours later. Patient not taking: Reported on 08/19/2018 10/14/15   Tanda Rockers, MD  OXYGEN Inhale 2 L/min into the lungs at bedtime.    [provider]  Respiratory Therapy Supplies (FLUTTER) DEVI AS DIRECTED 08/09/15   Tanda Rockers, MD  Rivaroxaban (XARELTO) 15 MG TABS tablet Take 1 tablet (15 mg total) by mouth 2 (two) times daily with a meal. Patient not taking: Reported on 08/19/2018 09/10/15   Tanda Rockers, MD  traMADol (ULTRAM) 50 MG tablet TAKE ONE TO TWO TABLETS BY MOUTH EVERY 4 HOURS AS NEEDED FOR COUGH OR PAIN Patient not taking: Reported on 08/19/2018 11/21/15   Tanda Rockers, MD    Current Facility-Administered Medications  Medication Dose Route Frequency Provider Last Rate Last Dose  . 0.9 %  sodium chloride infusion   Intravenous Continuous Regalado, Belkys A, MD 75 mL/hr at 08/20/18 0616    . acetaminophen (TYLENOL) tablet  650 mg  650 mg Oral Q6H PRN Regalado, Belkys A, MD   650 mg at 08/19/18 2215   Or  . acetaminophen (TYLENOL) suppository 650 mg  650 mg Rectal Q6H PRN Regalado, Belkys A, MD      . azithromycin (ZITHROMAX) 500 mg in sodium chloride 0.9 % 250 mL IVPB  500 mg Intravenous Q24H Regalado, Belkys A, MD   Stopped at 08/19/18 2250  . bethanechol (URECHOLINE) tablet 25 mg  25 mg Oral TID Regalado, Belkys A, MD      . cefTRIAXone (ROCEPHIN) 1 g in sodium chloride 0.9 % 100 mL IVPB  1 g Intravenous Q24H Regalado, Belkys A, MD   Stopped at 08/19/18 1953  . finasteride (PROSCAR) tablet 5 mg  5 mg Oral Daily Regalado, Belkys A, MD      . ondansetron (ZOFRAN) tablet 4 mg  4 mg Oral Q6H PRN Regalado, Belkys A, MD       Or  . ondansetron (ZOFRAN) injection 4 mg  4 mg Intravenous Q6H PRN Regalado, Belkys A, MD      . pantoprazole (PROTONIX) injection 40 mg  40 mg Intravenous Q12H Regalado, Belkys A, MD   40 mg at 08/19/18 2151  . sodium chloride flush (NS) 0.9 % injection 3 mL  3 mL Intravenous Q12H Regalado, Belkys A, MD   3 mL at 08/19/18 2151  . traMADol (ULTRAM) tablet 50 mg  50 mg Oral Q6H PRN Regalado, Belkys A, MD         Allergies as of 08/19/2018 - Review Complete 08/19/2018  Allergen Reaction Noted  . Levetiracetam Itching and Anxiety 08/09/2015  . Cymbalta [duloxetine hcl]  08/19/2018  . Lyrica [pregabalin]  08/19/2018  . Tamsulosin Itching 07/30/2015    History reviewed. No pertinent family history.  Social History   Socioeconomic History  . Marital status: Married    Spouse name: Not on file  . Number of children: Not on file  . Years of education: Not on file  . Highest education level: Not on file  Occupational History  . Not on file  Social Needs  . Financial resource strain: Not on file  . Food insecurity:    Worry: Not on file    Inability: Not on file  . Transportation needs:    Medical: Not on file    Non-medical: Not on file  Tobacco Use  . Smoking status: Former Smoker    Packs/day: 0.50    Years: 10.00    Pack years: 5.00    Types: Cigarettes    Last attempt to quit: 09/28/1978    Years since quitting: 39.9  . Smokeless tobacco: Never Used  Substance and Sexual Activity  . Alcohol use: Never    Alcohol/week: 0.0 standard drinks    Frequency: Never  . Drug use: Never  . Sexual activity: Not on file  Lifestyle  . Physical activity:    Days per week: Not on file    Minutes per session: Not on file  . Stress: Not on file  Relationships  . Social connections:    Talks on phone: Not on file    Gets together: Not on file    Attends religious service: Not on file    Active member of club or organization: Not on file    Attends meetings of clubs or organizations: Not on file    Relationship status: Not on file  . Intimate partner violence:    Fear of current or ex partner: Not  on file    Emotionally abused: Not on file    Physically abused: Not on file    Forced sexual activity: Not on file  Other Topics Concern  . Not on file  Social History Narrative  . Not on file    Review of Systems: All systems reviewed and negative except where noted in  HPI.  Physical Exam: Vital signs in last 24 hours: Temp:  [97.6 F (36.4 C)-100 F (37.8 C)] 97.6 F (36.4 C) (11/23 0330) Pulse Rate:  [66-91] 66 (11/23 0400) Resp:  [12-21] 16 (11/23 0400) BP: (111-132)/(57-77) 121/70 (11/23 0400) SpO2:  [92 %-100 %] 98 % (11/23 0400) Weight:  [120.8 kg] 120.8 kg (11/22 1731) Last BM Date: 08/19/18 General:   Alert, well-developed, white ale in NAD Psych:  Pleasant, cooperative. Normal mood and affect. Eyes:  Pupils equal, sclera clear, no icterus.   Conjunctiva pink. Ears:  Normal auditory acuity. Nose:  No deformity, discharge,  or lesions. Neck:  Supple; no masses Lungs:  Clear throughout to auscultation.   No wheezes, crackles, or rhonchi.  Heart:  Regular rate and rhythm; 1+ BLE edema Abdomen:  Soft, non-distended, nontender, BS active, no palp mass    Rectal:  Deferred  Msk:  Symmetrical without gross deformities. . Neurologic:  Alert and  oriented x4;  grossly normal neurologically. Skin:  Intact without significant lesions or rashes.   Intake/Output from previous day: 11/22 0701 - 11/23 0700 In: 1630.8 [I.V.:650.8; Blood:630; IV Piggyback:350] Out: 1448 [Urine:1025] Intake/Output this shift: No intake/output data recorded.  Lab Results: Recent Labs    08/19/18 1821 08/20/18 0257  WBC 7.6  --   HGB 7.8* 7.9*  HCT 25.6* 26.1*  PLT 190  --    BMET Recent Labs    08/19/18 1821  NA 139  K 3.8  CL 109  CO2 23  GLUCOSE 105*  BUN 27*  CREATININE 1.52*  CALCIUM 8.1*   LFT Recent Labs    08/19/18 1821  PROT 6.1*  ALBUMIN 3.0*  AST 18  ALT 15  ALKPHOS 52  BILITOT 0.8    Studies/Results: Dg Chest 2 View  Result Date: 08/19/2018 CLINICAL DATA:  Cough cough. History of COPD. EXAM: CHEST - 2 VIEW COMPARISON:  None. FINDINGS: Heart size and mediastinal contours are within normal limits. Chronic bronchitic changes noted centrally. Lungs otherwise clear. No confluent opacity to suggest a developing pneumonia. No  pleural effusion or pneumothorax seen. Osseous structures about the chest are unremarkable. IMPRESSION: 1. No active cardiopulmonary disease. No evidence of pneumonia or pulmonary edema. 2. Chronic bronchitic changes. Electronically Signed   By: Franki Cabot M.D.   On: 08/19/2018 22:23     Tye Savoy, NP-C @  08/20/2018, 8:54 AM

## 2018-08-20 NOTE — Progress Notes (Signed)
PROGRESS NOTE    Luis Bryan  XTK:[id number] DOB: [birth year] DOA: 08/19/2018 PCP: Maryella Shivers, MD    Brief Narrative: Luis Bryan is a 67 y.o. male with past medical history significant for hypertension, BPH, remote provoked PE, OSA not on CPAP, COPD, chronic kidney disease stage III, right TKR 11-04 at [facility], , complicated by urinary retention needing a Foley catheter. He follow-up with his urologist and the catheter was removed. He presents to [facility] on 11/21 2019 with dysuria, fever, chills. Evaluation at [facility] consistent with sepsis due to urinary tract infection and left lingular pneumonia, GNR bacteremia. Transfer from [facility] due to GI bleed. Patient develop blood per rectum.    Assessment & Plan:   Active Problems:   COPD GOLD 0    Morbid obesity (HCC)   GI bleed   PNA (pneumonia)   Acute lower UTI   1-Sepsis; Gram negative bacteremia. UTI he presented with hypotension, leukocytosis, at prior facility. His BP improved. Sepsis secondary to UTI and PNA.  He was treated with levaquin started 11-21. - Continue with  ceftriaxone. Blood culture from [facility] growing gram negative rods. Plan to repeat blood cultures 11-24. - follow up blood cultures and urine culture from [place].  -will need to repeat Blood cultures in 24 to 48 hours.  -will discussed with ID length of treatment due to recent knee replacement   2-UTI; with hematuria.  Related to prior foley catheter.  Monitor urine out put. Bladder scan.  Follow urine culture done here.  Continue with ceftriaxone.   3-Acute GI bleed. History of same.  Presents with blood per rectum, black stool.  IV fluids. Type and scren. Repeat hb blood transfusion as needed.  Received one unit PRBC 11-22,. No bleeding today, repeat hb in am.  IV protonix.   GI to reassess on Monday.   4-AKI on CKD stage III; per report cr one year ago was at 1.3.  Check renal function.  Cr  at [place] was 2.2.--1.9  Improving.   5-PNA, lingula. Started  ceftriaxone and Zithromax.  Chest x ray here clear. Will stop azithromycin.   6-urinary retention; he was started on bethanechol 25 mg TID, Proscar 5 mg daily, doxazosin. Will await for med rec.  7-history of stroke. Hold plavix.  8-recent Right knee sx; hold aspirin.   RN Pressure Injury Documentation:    Malnutrition Type:      Malnutrition Characteristics:      Nutrition Interventions:     Estimated body mass index is 34.19 kg/m as calculated from the following:   Height as of this encounter: 6\' 2"  (1.88 m).   Weight as of this encounter: 120.8 kg.   DVT prophylaxis: scd Code Status: full code.  Family Communication: care discussed with patient Disposition Plan; home when stable.   Consultants:   Gi    Procedures:   none   Antimicrobials: ceftriaxone    Subjective: Denies bloody BM Denies dyspnea.   Objective: Vitals:   08/20/18 1200 08/20/18 1300 08/20/18 1400 08/20/18 1500  BP: (!) 121/51 121/72 127/65 132/67  Pulse: (!) 105 64  76  Resp: 16 (!) 23 18 13   Temp:      TempSrc:      SpO2: 100% 97%  99%  Weight:      Height:        Intake/Output Summary (Last 24 hours) at 08/20/2018 1533 Last data filed at 08/20/2018 0629 Gross per 24 hour  Intake 1630.75 ml  Output 1025 ml  Net 605.75 ml   Filed Weights   08/19/18 1731  Weight: 120.8 kg    Examination:  General exam: Appears calm and comfortable  Respiratory system: Crackles bases.  Cardiovascular system: S1 & S2 heard, RRR. No JVD, murmurs, rubs, gallops or clicks. No pedal edema. Gastrointestinal system: Abdomen is nondistended, soft and nontender. No organomegaly or masses felt. Normal bowel sounds heard. Central nervous system: Alert and oriented. No focal neurological deficits. Extremities: Symmetric 5 x 5 power. Right knee incision healing,  Skin: No rashes, lesions or ulcers Psychiatry: Judgement and  insight appear normal. Mood & affect appropriate.     Data Reviewed: I have personally reviewed following labs and imaging studies  CBC: Recent Labs  Lab 08/19/18 1821 08/20/18 0257 08/20/18 0941  WBC 7.6  --  5.5  HGB 7.8* 7.9* 8.5*  HCT 25.6* 26.1* 28.0*  MCV 95.5  --  94.6  PLT 190  --  010   Basic Metabolic Panel: Recent Labs  Lab 08/19/18 1821 08/20/18 0941  NA 139 138  K 3.8 3.7  CL 109 108  CO2 23 24  GLUCOSE 105* 116*  BUN 27* 17  CREATININE 1.52* 1.09  CALCIUM 8.1* 8.4*   GFR: Estimated Creatinine Clearance: 89.5 mL/min (by C-G formula based on SCr of 1.09 mg/dL). Liver Function Tests: Recent Labs  Lab 08/19/18 1821  AST 18  ALT 15  ALKPHOS 52  BILITOT 0.8  PROT 6.1*  ALBUMIN 3.0*   No results for input(s): LIPASE, AMYLASE in the last 168 hours. No results for input(s): AMMONIA in the last 168 hours. Coagulation Profile: No results for input(s): INR, PROTIME in the last 168 hours. Cardiac Enzymes: No results for input(s): CKTOTAL, CKMB, CKMBINDEX, TROPONINI in the last 168 hours. BNP (last 3 results) No results for input(s): PROBNP in the last 8760 hours. HbA1C: No results for input(s): HGBA1C in the last 72 hours. CBG: No results for input(s): GLUCAP in the last 168 hours. Lipid Profile: No results for input(s): CHOL, HDL, LDLCALC, TRIG, CHOLHDL, LDLDIRECT in the last 72 hours. Thyroid Function Tests: No results for input(s): TSH, T4TOTAL, FREET4, T3FREE, THYROIDAB in the last 72 hours. Anemia Panel: Recent Labs    08/20/18 1157  VITAMINB12 194  FOLATE 9.5  FERRITIN 245  TIBC 264  IRON 25*  RETICCTPCT 1.7   Sepsis Labs: No results for input(s): PROCALCITON, LATICACIDVEN in the last 168 hours.  Recent Results (from the past 240 hour(s))  MRSA PCR Screening     Status: None   Collection Time: 08/19/18  6:21 PM  Result Value Ref Range Status   MRSA by PCR NEGATIVE NEGATIVE Final    Comment:        The GeneXpert MRSA Assay  (FDA approved for NASAL specimens only), is one component of a comprehensive MRSA colonization surveillance program. It is not intended to diagnose MRSA infection nor to guide or monitor treatment for MRSA infections. Performed at [facility], [place] [address] [place], [place] [address]          Radiology Studies: Dg Chest 2 View  Result Date: 08/19/2018 CLINICAL DATA:  Cough cough. History of COPD. EXAM: CHEST - 2 VIEW COMPARISON:  None. FINDINGS: Heart size and mediastinal contours are within normal limits. Chronic bronchitic changes noted centrally. Lungs otherwise clear. No confluent opacity to suggest a developing pneumonia. No pleural effusion or pneumothorax seen. Osseous structures about the chest are unremarkable. IMPRESSION: 1. No active cardiopulmonary disease.  No evidence of pneumonia or pulmonary edema. 2. Chronic bronchitic changes. Electronically Signed   By: Franki Cabot M.D.   On: 08/19/2018 22:23        Scheduled Meds: . bethanechol  25 mg Oral TID  . finasteride  5 mg Oral Daily  . gabapentin  1,200 mg Oral TID  . metoprolol tartrate  25 mg Oral QHS  . morphine  15 mg Oral Q12H  . pantoprazole (PROTONIX) IV  40 mg Intravenous Q12H  . rosuvastatin  10 mg Oral QHS  . sodium chloride flush  3 mL Intravenous Q12H   Continuous Infusions: . sodium chloride 75 mL/hr at 08/20/18 0616  . azithromycin Stopped (08/19/18 2250)  . cefTRIAXone (ROCEPHIN)  IV       LOS: 1 day    Time spent: 35 minutes.     Elmarie Shiley, MD [facility] Pager [phone]  If 7PM-7AM, please contact night-coverage [email or web address] Password [facility] 08/20/2018, 3:33 PM

## 2018-08-21 DIAGNOSIS — R7881 Bacteremia: Secondary | ICD-10-CM

## 2018-08-21 DIAGNOSIS — A4151 Sepsis due to Escherichia coli [E. coli]: Secondary | ICD-10-CM

## 2018-08-21 DIAGNOSIS — B9620 Unspecified Escherichia coli [E. coli] as the cause of diseases classified elsewhere: Secondary | ICD-10-CM

## 2018-08-21 LAB — BASIC METABOLIC PANEL
ANION GAP: 6 (ref 5–15)
BUN: 14 mg/dL (ref 8–23)
CALCIUM: 8.6 mg/dL — AB (ref 8.9–10.3)
CO2: 24 mmol/L (ref 22–32)
Chloride: 112 mmol/L — ABNORMAL HIGH (ref 98–111)
Creatinine, Ser: 0.92 mg/dL (ref 0.61–1.24)
GFR calc Af Amer: >60 mL/min (ref >60)
Glucose, Bld: 106 mg/dL — ABNORMAL HIGH (ref 70–99)
POTASSIUM: 3.8 mmol/L (ref 3.5–5.1)
SODIUM: 142 mmol/L (ref 135–145)

## 2018-08-21 LAB — URINE CULTURE: Culture: <10000 — AB

## 2018-08-21 LAB — CBC
HCT: 28.3 % — ABNORMAL LOW (ref 39.0–52.0)
Hemoglobin: 8.6 g/dL — ABNORMAL LOW (ref 13.0–17.0)
MCH: 28.1 pg (ref 26.0–34.0)
MCHC: 30.4 g/dL (ref 30.0–36.0)
MCV: 92.5 fL (ref 80.0–100.0)
NRBC: 0 % (ref 0.0–0.2)
PLATELETS: 233 10*3/uL (ref 150–400)
RBC: 3.06 MIL/uL — AB (ref 4.22–5.81)
RDW: 14.9 % (ref 11.5–15.5)
WBC: 5.1 10*3/uL (ref 4.0–10.5)

## 2018-08-21 LAB — OCCULT BLOOD X 1 CARD TO LAB, STOOL: FECAL OCCULT BLD: NEGATIVE

## 2018-08-21 MED ORDER — SODIUM CHLORIDE 0.9 % IV SOLN
2.0000 g | Freq: Three times a day (TID) | INTRAVENOUS | Status: DC
  Administered 2018-08-21 – 2018-08-27 (×18): 2 g via INTRAVENOUS
  Filled 2018-08-21 (×20): qty 2

## 2018-08-21 MED ORDER — SODIUM CHLORIDE 0.9 % IV SOLN
2.0000 g | INTRAVENOUS | Status: AC
  Administered 2018-08-21: 2 g via INTRAVENOUS
  Filled 2018-08-21: qty 2

## 2018-08-21 MED ORDER — MORPHINE SULFATE ER 15 MG PO TBCR
15.0000 mg | EXTENDED_RELEASE_TABLET | Freq: Three times a day (TID) | ORAL | Status: DC
  Administered 2018-08-21 – 2018-08-27 (×19): 15 mg via ORAL
  Filled 2018-08-21 (×19): qty 1

## 2018-08-21 MED ORDER — PRIMIDONE 50 MG PO TABS
200.0000 mg | ORAL_TABLET | Freq: Every day | ORAL | Status: DC
  Administered 2018-08-21 – 2018-08-26 (×6): 200 mg via ORAL
  Filled 2018-08-21 (×6): qty 4

## 2018-08-21 NOTE — Progress Notes (Signed)
Brief Pharmacy Note:   Editor, commissioning at [facility] regarding culture results.   11/22 Urine Cx: > 100K E.col (sensitive to Ceftriaxone, Cefepime, Zosyn, Imipenem, Ertapenem, Nitrofurantoin, Gentamicin, Tobramycin). ESBL negative  11/22 Blood Cx: 4/4 bottles E.coli -susceptibilities on aerobic bottles: sensitive to Cefepime, Zosyn, Imipenem, Ertapenem, Gentamicin, Tobramycin; Intermediate to Ceftriaxone. ESBL negative. -susceptibilities on anaerobic bottles still pending   Discussed with Dr. Dwan Bolt Ceftriaxone to Cefepime 2g IV q8h per MD.   Lindell Spar, PharmD, BCPS Pager: [phone] 08/21/2018 3:14 PM

## 2018-08-21 NOTE — Progress Notes (Addendum)
PROGRESS NOTE    Luis Bryan  OYD:[id number] DOB: November 26, 1950 DOA: 08/19/2018 PCP: Maryella Shivers, MD    Brief Narrative: Luis Bryan is a 67 y.o. male with past medical history significant for hypertension, BPH, remote provoked PE, OSA not on CPAP, COPD, chronic kidney disease stage III, right TKR 11-04 at [facility], , complicated by urinary retention needing a Foley catheter. He follow-up with his urologist and the catheter was removed. He presents to [facility] on 11/21 2019 with dysuria, fever, chills. Evaluation at [facility] consistent with sepsis due to urinary tract infection and left lingular pneumonia, GNR bacteremia. Transfer from [facility] due to GI bleed. Patient develop blood per rectum.    Assessment & Plan:   Active Problems:   COPD GOLD 0    Morbid obesity (HCC)   GI bleed   PNA (pneumonia)   Acute lower UTI   E coli bacteremia   Sepsis due to Escherichia coli (E. coli) (HCC)   1-Sepsis; due to E coli Bacteremia.  UTI He presented with hypotension, leukocytosis, at prior facility. His BP improved. Sepsis secondary to UTI and PNA.  He was treated with levaquin started 11-21. - Continue with  ceftriaxone. Blood culture from [facility] growing gram negative rods. Plan to repeat blood cultures 11-24. - follow up blood cultures and urine culture from [place].  -will need to repeat Blood cultures today  -will discussed with ID length of treatment due to recent knee replacement  -Appreciate pharmacist assistance with getting cultures from [facility]. -Blood cultures E. coli, with intermediate sensitive to Ceftriaxone. Change antibiotics to cefepime.   2-UTI; with hematuria.  Related to prior foley catheter.  Monitor urine out put. Bladder scan.  Follow urine culture done here.  Started on IV cefepime  3-Acute GI bleed. History of same.  Presents with blood per rectum, black stool.  IV fluids. Type and scren. Repeat hb blood  transfusion as needed.  Received one unit PRBC 11-22,. No bleeding today, repeat hb in am.  IV protonix.   GI to reassess on Monday.  Hemoglobin remained stable.  4-AKI on CKD stage III; per report cr one year ago was at 1.3.  Check renal function.  Cr at [place] was 2.2.--1.9  Improving.   5-PNA, lingula. Started  ceftriaxone and Zithromax.  Chest x ray here clear. Will stop azithromycin.   6-urinary retention; he was started on bethanechol 25 mg TID, Proscar 5 mg daily, doxazosin. Will await for med rec.  7-history of stroke. Hold plavix.   8-recent Right knee sx; hold aspirin.   9-chronic pain.  Resume MS Contin.  10-fibromyalgia continue with gabapentin  RN Pressure Injury Documentation:    Malnutrition Type:      Malnutrition Characteristics:      Nutrition Interventions:     Estimated body mass index is 34.19 kg/m as calculated from the following:   Height as of this encounter: 6\' 2"  (1.88 m).   Weight as of this encounter: 120.8 kg.   DVT prophylaxis: scd Code Status: full code.  Family Communication: care discussed with patient Disposition Plan; home when stable.  Transfer to [place] now that he is hemodynamically stable  Consultants:   Gi    Procedures:   none   Antimicrobials: ceftriaxone    Subjective: No bowel movement, denies any blood per rectum.  Objective: Vitals:   08/21/18 0834 08/21/18 0835 08/21/18 1032 08/21/18 1359  BP: 126/70  138/72 117/68  Pulse:  72 67 69  Resp: 18 13 17 18   Temp:    97.8 F (36.6 C)  TempSrc:    Oral  SpO2:  98% 97% 99%  Weight:      Height:        Intake/Output Summary (Last 24 hours) at 08/21/2018 1539 Last data filed at 08/21/2018 1400 Gross per 24 hour  Intake 2000.67 ml  Output 1025 ml  Net 975.67 ml   Filed Weights   08/19/18 1731  Weight: 120.8 kg    Examination:  General exam: No acute distress. Respiratory system: Normal respiratory effort, clear to  auscultation. Cardiovascular system: 1 S2, regular rhythm rate Gastrointestinal system: No sounds present, soft nontender nondistended Central nervous system: Alert and oriented, nonfocal. Extremities: Symmetric 5 x 5 power. Right knee incision healing,  Skin: No rashes, no lesions. Psychiatry: Mood and affect appropriate    Data Reviewed: I have personally reviewed following labs and imaging studies  CBC: Recent Labs  Lab 08/19/18 1821 08/20/18 0257 08/20/18 0941 08/21/18 0729  WBC 7.6  --  5.5 5.1  HGB 7.8* 7.9* 8.5* 8.6*  HCT 25.6* 26.1* 28.0* 28.3*  MCV 95.5  --  94.6 92.5  PLT 190  --  198 606   Basic Metabolic Panel: Recent Labs  Lab 08/19/18 1821 08/20/18 0941 08/21/18 0729  NA 139 138 142  K 3.8 3.7 3.8  CL 109 108 112*  CO2 23 24 24   GLUCOSE 105* 116* 106*  BUN 27* 17 14  CREATININE 1.52* 1.09 0.92  CALCIUM 8.1* 8.4* 8.6*   GFR: Estimated Creatinine Clearance: 106.1 mL/min (by C-G formula based on SCr of 0.92 mg/dL). Liver Function Tests: Recent Labs  Lab 08/19/18 1821  AST 18  ALT 15  ALKPHOS 52  BILITOT 0.8  PROT 6.1*  ALBUMIN 3.0*   No results for input(s): LIPASE, AMYLASE in the last 168 hours. No results for input(s): AMMONIA in the last 168 hours. Coagulation Profile: No results for input(s): INR, PROTIME in the last 168 hours. Cardiac Enzymes: No results for input(s): CKTOTAL, CKMB, CKMBINDEX, TROPONINI in the last 168 hours. BNP (last 3 results) No results for input(s): PROBNP in the last 8760 hours. HbA1C: No results for input(s): HGBA1C in the last 72 hours. CBG: No results for input(s): GLUCAP in the last 168 hours. Lipid Profile: No results for input(s): CHOL, HDL, LDLCALC, TRIG, CHOLHDL, LDLDIRECT in the last 72 hours. Thyroid Function Tests: No results for input(s): TSH, T4TOTAL, FREET4, T3FREE, THYROIDAB in the last 72 hours. Anemia Panel: Recent Labs    08/20/18 1157  VITAMINB12 194  FOLATE 9.5  FERRITIN 245  TIBC  264  IRON 25*  RETICCTPCT 1.7   Sepsis Labs: No results for input(s): PROCALCITON, LATICACIDVEN in the last 168 hours.  Recent Results (from the past 240 hour(s))  MRSA PCR Screening     Status: None   Collection Time: 08/19/18  6:21 PM  Result Value Ref Range Status   MRSA by PCR NEGATIVE NEGATIVE Final    Comment:        The GeneXpert MRSA Assay (FDA approved for NASAL specimens only), is one component of a comprehensive MRSA colonization surveillance program. It is not intended to diagnose MRSA infection nor to guide or monitor treatment for MRSA infections. Performed at [facility], [place] [address], [place], [place] [address]   Urine Culture     Status: Abnormal   Collection Time: 08/19/18  6:22 PM  Result Value Ref Range Status   Specimen  Description   Final    URINE, CLEAN CATCH Performed at [facility], [place] [address] [place], [place] [address]    Special Requests   Final    NONE Performed at [facility], [place] [address], [place], [place] [address]    Culture (A)  Final    <10,000 COLONIES/mL INSIGNIFICANT GROWTH Performed at [place] [address] [place], [place] [address]    Report Status 08/21/2018 FINAL  Final  Culture, blood (routine x 2)     Status: None (Preliminary result)   Collection Time: 08/21/18  7:29 AM  Result Value Ref Range Status   Specimen Description   Final    BLOOD RIGHT ANTECUBITAL Performed at [place] [address] [place], [place] [address]    Special Requests   Final    BOTTLES DRAWN AEROBIC ONLY Blood Culture adequate volume Performed at [place] [address] [place], [place] [address]    Culture   Final    NO GROWTH <12 HOURS Performed at [place] [address] [place], [place] [address]    Report Status PENDING  Incomplete  Culture, blood (routine x 2)     Status: None (Preliminary  result)   Collection Time: 08/21/18  7:30 AM  Result Value Ref Range Status   Specimen Description   Final    BLOOD LEFT HAND Performed at [place] [address] [place], [place] [address]    Special Requests   Final    BOTTLES DRAWN AEROBIC ONLY Blood Culture adequate volume Performed at [place] [address] [place], [place] [address]    Culture   Final    NO GROWTH <12 HOURS Performed at [place] [address], [place],  [address]    Report Status PENDING  Incomplete         Radiology Studies: Dg Chest 2 View  Result Date: 08/19/2018 CLINICAL DATA:  Cough cough. History of COPD. EXAM: CHEST - 2 VIEW COMPARISON:  None. FINDINGS: Heart size and mediastinal contours are within normal limits. Chronic bronchitic changes noted centrally. Lungs otherwise clear. No confluent opacity to suggest a developing pneumonia. No pleural effusion or pneumothorax seen. Osseous structures about the chest are unremarkable. IMPRESSION: 1. No active cardiopulmonary disease. No evidence of pneumonia or pulmonary edema. 2. Chronic bronchitic changes. Electronically Signed   By: Franki Cabot M.D.   On: 08/19/2018 22:23        Scheduled Meds: . bethanechol  25 mg Oral TID  . finasteride  5 mg Oral Daily  . gabapentin  1,200 mg Oral TID  . metoprolol tartrate  25 mg Oral QHS  . morphine  15 mg Oral Q8H  . pantoprazole (PROTONIX) IV  40 mg Intravenous Q12H  . primidone  200 mg Oral QHS  . rosuvastatin  10 mg Oral QHS  . sodium chloride flush  3 mL Intravenous Q12H   Continuous Infusions: . sodium chloride 75 mL/hr at 08/21/18 1418  . ceFEPime (MAXIPIME) IV    . ceFEPime (MAXIPIME) IV       LOS: 2 days    Time spent: 35 minutes.     Elmarie Shiley, MD [facility] Pager [phone]  If 7PM-7AM, please contact night-coverage [email or web address] Password [facility] 08/21/2018, 3:39 PM

## 2018-08-22 ENCOUNTER — Inpatient Hospital Stay (HOSPITAL_COMMUNITY): Payer: Medicare Other

## 2018-08-22 DIAGNOSIS — Z8719 Personal history of other diseases of the digestive system: Secondary | ICD-10-CM

## 2018-08-22 DIAGNOSIS — Z7902 Long term (current) use of antithrombotics/antiplatelets: Secondary | ICD-10-CM

## 2018-08-22 DIAGNOSIS — D508 Other iron deficiency anemias: Secondary | ICD-10-CM

## 2018-08-22 LAB — CBC
HEMATOCRIT: 26.9 % — AB (ref 39.0–52.0)
Hemoglobin: 8.2 g/dL — ABNORMAL LOW (ref 13.0–17.0)
MCH: 28.9 pg (ref 26.0–34.0)
MCHC: 30.5 g/dL (ref 30.0–36.0)
MCV: 94.7 fL (ref 80.0–100.0)
NRBC: 0 % (ref 0.0–0.2)
PLATELETS: 199 10*3/uL (ref 150–400)
RBC: 2.84 MIL/uL — ABNORMAL LOW (ref 4.22–5.81)
RDW: 14.6 % (ref 11.5–15.5)
WBC: 4.6 10*3/uL (ref 4.0–10.5)

## 2018-08-22 LAB — BASIC METABOLIC PANEL
ANION GAP: 8 (ref 5–15)
BUN: 12 mg/dL (ref 8–23)
CALCIUM: 8.4 mg/dL — AB (ref 8.9–10.3)
CO2: 21 mmol/L — AB (ref 22–32)
Chloride: 113 mmol/L — ABNORMAL HIGH (ref 98–111)
Creatinine, Ser: 0.84 mg/dL (ref 0.61–1.24)
Glucose, Bld: 95 mg/dL (ref 70–99)
Potassium: 3.5 mmol/L (ref 3.5–5.1)
Sodium: 142 mmol/L (ref 135–145)

## 2018-08-22 MED ORDER — IOHEXOL 300 MG/ML  SOLN
30.0000 mL | Freq: Once | INTRAMUSCULAR | Status: AC | PRN
  Administered 2018-08-22: 30 mL via ORAL

## 2018-08-22 NOTE — Progress Notes (Signed)
Patient transferred to CT scan dept. He will be returning to room 1502. Report called to [place] on [place]. Questions answered. Wife is here and took patient belongings. Donne Hazel, RN

## 2018-08-22 NOTE — Progress Notes (Signed)
PROGRESS NOTE    Luis Bryan  OJJ:[id number] DOB: 1950-11-02 DOA: 08/19/2018 PCP: Maryella Shivers, MD    Brief Narrative: Luis Bryan is a 67 y.o. male with past medical history significant for hypertension, BPH, remote provoked PE, OSA not on CPAP, COPD, chronic kidney disease stage III, right TKR 11-04 at [facility], , complicated by urinary retention needing a Foley catheter. He follow-up with his urologist and the catheter was removed. He presents to [facility] on 11/21 2019 with dysuria, fever, chills. Evaluation at [facility] consistent with sepsis due to urinary tract infection and left lingular pneumonia, GNR bacteremia. Transfer from [facility] due to GI bleed. Patient develop blood per rectum.    Assessment & Plan:   Active Problems:   COPD GOLD 0    Morbid obesity (HCC)   GI bleed   PNA (pneumonia)   Acute lower UTI   E coli bacteremia   Sepsis due to Escherichia coli (E. coli) (HCC)   1-Sepsis; due to E coli Bacteremia.  UTI He presented with hypotension, leukocytosis, at prior facility. His BP improved. Sepsis secondary to UTI and PNA.  He was treated with levaquin started 11-21. - Continue with  ceftriaxone. Blood culture from [facility] growing gram negative rods. Plan to repeat blood cultures 11-24. - follow up blood cultures and urine culture from [place].  -will need to repeat Blood cultures today  -Appreciate pharmacist assistance with getting cultures from [facility]. -Blood cultures E. coli, with intermediate sensitive to Ceftriaxone. Change antibiotics to cefepime.  -Discussed with ID, recommend CT abdomen to rule out different source of infection due to difference E coli sensitivity.  -patient will needs 7 days of IV antibiotics after last negative blood cultures. E coli was resistant to all oral options.   2-UTI; with hematuria.  Related to prior foley catheter.  Monitor urine out put. Bladder scan.  Follow urine culture  done here.  Started on IV cefepime Will need 7 days of IV antibiotics from 11-24. Last blood culture done   3-Acute GI bleed. History of same.  Presents with blood per rectum, black stool.  IV fluids. Type and scren. Repeat hb blood transfusion as needed.  Received one unit PRBC 11-22,. No bleeding today, repeat hb in am.  IV protonix.   Hemoglobin remained stable. GI to evaluate today.   4-AKI on CKD stage III; per report cr one year ago was at 1.3.  Check renal function.  Cr at [place] was 2.2.--1.9  Improving.   5-PNA, lingula. Started  ceftriaxone and Zithromax.  Chest x ray here clear. Azithromycin stopped   6-urinary retention; he was started on bethanechol 25 mg TID, Proscar 5 mg daily, doxazosin. Will await for med rec.  7-history of stroke. Hold plavix.   8-recent Right knee sx; hold aspirin.   9-chronic pain.  Resume MS Contin.  10-fibromyalgia continue with gabapentin  RN Pressure Injury Documentation:    Malnutrition Type:      Malnutrition Characteristics:      Nutrition Interventions:     Estimated body mass index is 34.19 kg/m as calculated from the following:   Height as of this encounter: 6\' 2"  (1.88 m).   Weight as of this encounter: 120.8 kg.   DVT prophylaxis: scd Code Status: full code.  Family Communication: care discussed with patient Disposition Plan; home when stable.  Transfer to [place] now that he is hemodynamically stable  Consultants:   Gi    Procedures:   none  Antimicrobials: ceftriaxone    Subjective: No further BM, no blood in the stool.   Objective: Vitals:   08/21/18 1359 08/21/18 2049 08/22/18 0555 08/22/18 1340  BP: 117/68 113/62 125/68 (!) 141/74  Pulse: 69 69 68 (!) 51  Resp: 18 18 18 18   Temp: 97.8 F (36.6 C) 97.8 F (36.6 C) 98.4 F (36.9 C) 98 F (36.7 C)  TempSrc: Oral Oral Oral Oral  SpO2: 99% 97% 96% 99%  Weight:      Height:        Intake/Output Summary (Last 24 hours) at  08/22/2018 1518 Last data filed at 08/22/2018 1500 Gross per 24 hour  Intake 2666.55 ml  Output 900 ml  Net 1766.55 ml   Filed Weights   08/19/18 1731  Weight: 120.8 kg    Examination:  General exam: NAD Respiratory system: CTA Cardiovascular system: S 1, S 2 RRR Gastrointestinal system: BS present., soft, nt Central nervous system: non focal.  Extremities: Symmetric 5 x 5 power. Right knee no edema, no redness, no worsening pain, incision healing.  Skin: No rash      Data Reviewed: I have personally reviewed following labs and imaging studies  CBC: Recent Labs  Lab 08/19/18 1821 08/20/18 0257 08/20/18 0941 08/21/18 0729 08/22/18 0415  WBC 7.6  --  5.5 5.1 4.6  HGB 7.8* 7.9* 8.5* 8.6* 8.2*  HCT 25.6* 26.1* 28.0* 28.3* 26.9*  MCV 95.5  --  94.6 92.5 94.7  PLT 190  --  198 233 322   Basic Metabolic Panel: Recent Labs  Lab 08/19/18 1821 08/20/18 0941 08/21/18 0729 08/22/18 0415  NA 139 138 142 142  K 3.8 3.7 3.8 3.5  CL 109 108 112* 113*  CO2 23 24 24  21*  GLUCOSE 105* 116* 106* 95  BUN 27* 17 14 12   CREATININE 1.52* 1.09 0.92 0.84  CALCIUM 8.1* 8.4* 8.6* 8.4*   GFR: Estimated Creatinine Clearance: 116.2 mL/min (by C-G formula based on SCr of 0.84 mg/dL). Liver Function Tests: Recent Labs  Lab 08/19/18 1821  AST 18  ALT 15  ALKPHOS 52  BILITOT 0.8  PROT 6.1*  ALBUMIN 3.0*   No results for input(s): LIPASE, AMYLASE in the last 168 hours. No results for input(s): AMMONIA in the last 168 hours. Coagulation Profile: No results for input(s): INR, PROTIME in the last 168 hours. Cardiac Enzymes: No results for input(s): CKTOTAL, CKMB, CKMBINDEX, TROPONINI in the last 168 hours. BNP (last 3 results) No results for input(s): PROBNP in the last 8760 hours. HbA1C: No results for input(s): HGBA1C in the last 72 hours. CBG: No results for input(s): GLUCAP in the last 168 hours. Lipid Profile: No results for input(s): CHOL, HDL, LDLCALC, TRIG,  CHOLHDL, LDLDIRECT in the last 72 hours. Thyroid Function Tests: No results for input(s): TSH, T4TOTAL, FREET4, T3FREE, THYROIDAB in the last 72 hours. Anemia Panel: Recent Labs    08/20/18 1157  VITAMINB12 194  FOLATE 9.5  FERRITIN 245  TIBC 264  IRON 25*  RETICCTPCT 1.7   Sepsis Labs: No results for input(s): PROCALCITON, LATICACIDVEN in the last 168 hours.  Recent Results (from the past 240 hour(s))  MRSA PCR Screening     Status: None   Collection Time: 08/19/18  6:21 PM  Result Value Ref Range Status   MRSA by PCR NEGATIVE NEGATIVE Final    Comment:        The GeneXpert MRSA Assay (FDA approved for NASAL specimens only), is one component of a comprehensive  MRSA colonization surveillance program. It is not intended to diagnose MRSA infection nor to guide or monitor treatment for MRSA infections. Performed at [facility], [place] [address] [place], [place] [address]   Urine Culture     Status: Abnormal   Collection Time: 08/19/18  6:22 PM  Result Value Ref Range Status   Specimen Description   Final    URINE, CLEAN CATCH Performed at [facility], [place] [address] [place], [place] [address]    Special Requests   Final    NONE Performed at [facility], [place] [address], [place], [place] [address]    Culture (A)  Final    <10,000 COLONIES/mL INSIGNIFICANT GROWTH Performed at [place] [address] [place], [place] [address]    Report Status 08/21/2018 FINAL  Final  Culture, blood (routine x 2)     Status: None (Preliminary result)   Collection Time: 08/21/18  7:29 AM  Result Value Ref Range Status   Specimen Description   Final    BLOOD RIGHT ANTECUBITAL Performed at [place] [address] [place], [place] [address]    Special Requests   Final    BOTTLES DRAWN AEROBIC ONLY Blood Culture adequate volume Performed at [place] [address], [place], [place] [address]    Culture   Final    NO GROWTH 1 DAY Performed at [facility], [place] [address] [place], [place] [address]    Report Status PENDING  Incomplete  Culture, blood (routine x 2)     Status: None (Preliminary result)   Collection Time: 08/21/18  7:30 AM  Result Value Ref Range Status   Specimen Description   Final    BLOOD LEFT HAND Performed at [place] [address] [place], [place] [address]    Special Requests   Final    BOTTLES DRAWN AEROBIC ONLY Blood Culture adequate volume Performed at [place] [address] [place], [place] [address]    Culture   Final    NO GROWTH 1 DAY Performed at [facility], [place] [address], [place],  [address]    Report Status PENDING  Incomplete         Radiology Studies: Ct Abdomen Pelvis Wo Contrast  Result Date: 08/22/2018 CLINICAL DATA:  Sepsis. EXAM: CT ABDOMEN AND PELVIS WITHOUT CONTRAST TECHNIQUE: Multidetector CT imaging of the abdomen and pelvis was performed following the standard protocol without IV contrast. COMPARISON:  None. FINDINGS: Lower chest: No acute abnormality. Hepatobiliary: Status post cholecystectomy. No biliary dilatation is noted. Probable cyst seen in posterior segment of right hepatic lobe. Pancreas: Unremarkable. No pancreatic ductal dilatation or surrounding inflammatory changes. Spleen: Normal in size without focal abnormality. Adrenals/Urinary Tract: Adrenal glands and kidneys are unremarkable. No hydronephrosis or renal obstruction is noted. No renal or ureteral calculi are noted. Mild wall thickening with minimal surrounding inflammatory changes is seen involving the urinary bladder suggesting cystitis. Stomach/Bowel: Stomach is within normal limits. Appendix appears normal. No evidence of bowel wall thickening, distention, or inflammatory changes. Vascular/Lymphatic: Aortic atherosclerosis. No enlarged abdominal or pelvic  lymph nodes. Reproductive: Moderate prostatic enlargement is noted. Other: No abdominal wall hernia or abnormality. No abdominopelvic ascites. Musculoskeletal: No acute or significant osseous findings. IMPRESSION: Mild urinary bladder wall thickening is noted with minimal surrounding inflammatory changes consistent with cystitis. Clinical correlation is recommended. Moderate prostatic enlargement is noted. Aortic Atherosclerosis (ICD10-I70.0).  Electronically Signed   By: Marijo Conception, M.D.   On: 08/22/2018 13:49        Scheduled Meds: . bethanechol  25 mg Oral TID  . finasteride  5 mg Oral Daily  . gabapentin  1,200 mg Oral TID  . metoprolol tartrate  25 mg Oral QHS  . morphine  15 mg Oral Q8H  . pantoprazole (PROTONIX) IV  40 mg Intravenous Q12H  . primidone  200 mg Oral QHS  . rosuvastatin  10 mg Oral QHS  . sodium chloride flush  3 mL Intravenous Q12H   Continuous Infusions: . ceFEPime (MAXIPIME) IV Stopped (08/22/18 1413)     LOS: 3 days    Time spent: 35 minutes.     Elmarie Shiley, MD [facility] Pager [phone]  If 7PM-7AM, please contact night-coverage [email or web address] Password [facility] 08/22/2018, 3:18 PM

## 2018-08-22 NOTE — Progress Notes (Signed)
    Progress Note   Subjective  Chief Complaint: Hematochezia, anemia  This morning, patient tells me he has had no further rectal bleeding, he has had his aspirin and Plavix on hold since admission.  Overall the patient feels much better.   Objective   Vital signs in last 24 hours: Temp:  [97.8 F (36.6 C)-98.4 F (36.9 C)] 98.4 F (36.9 C) (11/25 0555) Pulse Rate:  [67-69] 68 (11/25 0555) Resp:  [17-18] 18 (11/25 0555) BP: (113-138)/(62-72) 125/68 (11/25 0555) SpO2:  [96 %-99 %] 96 % (11/25 0555) Last BM Date: 08/21/18 General:    white male in NAD Heart:  Regular rate and rhythm; no murmurs Lungs: Respirations even and unlabored, lungs CTA bilaterally Abdomen:  Soft, nontender and nondistended. Normal bowel sounds. Extremities:  Without edema. Neurologic:  Alert and oriented,  grossly normal neurologically. Psych:  Cooperative. Normal mood and affect.  Intake/Output from previous day: 11/24 0701 - 11/25 0700 In: 2216.3 [P.O.:480; I.V.:1541.6; IV Piggyback:194.8] Out: 1400 [Urine:1400]  Lab Results: Recent Labs    08/20/18 0941 08/21/18 0729 08/22/18 0415  WBC 5.5 5.1 4.6  HGB 8.5* 8.6* 8.2*  HCT 28.0* 28.3* 26.9*  PLT 198 233 199   BMET Recent Labs    08/20/18 0941 08/21/18 0729 08/22/18 0415  NA 138 142 142  K 3.7 3.8 3.5  CL 108 112* 113*  CO2 24 24 21*  GLUCOSE 116* 106* 95  BUN 17 14 12   CREATININE 1.09 0.92 0.84  CALCIUM 8.4* 8.6* 8.4*   LFT Recent Labs    08/19/18 1821  PROT 6.1*  ALBUMIN 3.0*  AST 18  ALT 15  ALKPHOS 52  BILITOT 0.8    Assessment / Plan:   Assessment: 1.  Rectal bleeding: At time of admission, hemoglobin was 7.9, now stable over the weekend, 8.2 currently, no further signs of rectal bleeding per patient, initially related to aspirin being added to patient's Plavix after total knee replacement, patient's blood thinners have been on hold now, last colonoscopy 5 years ago, will need to determine last dose of Plavix 2.   Normocytic anemia: Stable over the weekend 3.  Sepsis/gram-negative bacteremia/UTI/pneumonia 4.  AKI on CKD 3 5.  Urinary retention  Plan: 1.  Per patient he will proceed with EGD and colonoscopy if we deem it necessary.  Will discuss further with Dr. Rush Landmark today.  Will need to determine last dose of Plavix 2.  Continue supportive measures 3.  Please await any further recommendations from Dr. Rush Landmark later today.  Thank you for your kind consultation, we will continue to follow.    LOS: 3 days   Levin Erp  08/22/2018, 9:37 AM

## 2018-08-22 NOTE — Progress Notes (Signed)
Pt arrived to the floor via wheelchair. Pt denies pain at this time with no s/s of distress noted. Pts assessment is unchanged from earlier documentation. Will continue to monitor.

## 2018-08-22 NOTE — Care Management Important Message (Signed)
Important Message  Patient Details  Name: Luis Bryan MRN: [id number] Date of Birth: 1951-04-18   Medicare Important Message Given:  Yes    Kerin Salen 08/22/2018, 12:29 [place] Message  Patient Details  Name: Luis Bryan MRN: [id number] Date of Birth: [birth year]   Medicare Important Message Given:  Yes    Kerin Salen 08/22/2018, 12:29 PM

## 2018-08-23 DIAGNOSIS — R7881 Bacteremia: Secondary | ICD-10-CM

## 2018-08-23 LAB — CBC
HEMATOCRIT: 29.1 % — AB (ref 39.0–52.0)
Hemoglobin: 9.1 g/dL — ABNORMAL LOW (ref 13.0–17.0)
MCH: 28.2 pg (ref 26.0–34.0)
MCHC: 31.3 g/dL (ref 30.0–36.0)
MCV: 90.1 fL (ref 80.0–100.0)
NRBC: 0 % (ref 0.0–0.2)
Platelets: 235 10*3/uL (ref 150–400)
RBC: 3.23 MIL/uL — AB (ref 4.22–5.81)
RDW: 14.4 % (ref 11.5–15.5)
WBC: 5.4 10*3/uL (ref 4.0–10.5)

## 2018-08-23 MED ORDER — CLOPIDOGREL BISULFATE 75 MG PO TABS
75.0000 mg | ORAL_TABLET | Freq: Every day | ORAL | Status: DC
  Administered 2018-08-23 – 2018-08-27 (×5): 75 mg via ORAL
  Filled 2018-08-23 (×5): qty 1

## 2018-08-23 MED ORDER — POLYETHYLENE GLYCOL 3350 17 G PO PACK
17.0000 g | PACK | Freq: Every day | ORAL | Status: DC
  Administered 2018-08-23 – 2018-08-24 (×2): 17 g via ORAL
  Filled 2018-08-23 (×2): qty 1

## 2018-08-23 NOTE — Progress Notes (Signed)
Progress Note   Subjective  Chief Complaint: Hematochezia, anemia  Again, no further rectal bleeding, patient tells me that he is feeling better but still feels like he needs to "recover" from this hospitalization before proceeding with further evaluation of his anemia.   Objective   Vital signs in last 24 hours: Temp:  [98 F (36.7 C)-98.3 F (36.8 C)] 98.1 F (36.7 C) (11/26 0616) Pulse Rate:  [51-71] 68 (11/26 0616) Resp:  [16-20] 20 (11/26 0616) BP: (125-141)/(73-78) 140/78 (11/26 0616) SpO2:  [96 %-99 %] 97 % (11/26 0616) Last BM Date: 08/21/18 General:    white male in NAD Heart:  Regular rate and rhythm; no murmurs Lungs: Respirations even and unlabored, lungs CTA bilaterally Abdomen:  Soft, nontender and nondistended. Normal bowel sounds. Extremities:  Without edema. Neurologic:  Alert and oriented,  grossly normal neurologically. Psych:  Cooperative. Normal mood and affect.  Intake/Output from previous day: 11/25 0701 - 11/26 0700 In: 1191.5 [P.O.:960; IV Piggyback:231.5] Out: 300 [Urine:300] Intake/Output this shift: Total I/O In: 111.5 [IV Piggyback:111.5] Out: -   Lab Results: Recent Labs    08/21/18 0729 08/22/18 0415 08/23/18 0738  WBC 5.1 4.6 5.4  HGB 8.6* 8.2* 9.1*  HCT 28.3* 26.9* 29.1*  PLT 233 199 235   BMET Recent Labs    08/21/18 0729 08/22/18 0415  NA 142 142  K 3.8 3.5  CL 112* 113*  CO2 24 21*  GLUCOSE 106* 95  BUN 14 12  CREATININE 0.92 0.84  CALCIUM 8.6* 8.4*   Studies/Results: Ct Abdomen Pelvis Wo Contrast  Result Date: 08/22/2018 CLINICAL DATA:  Sepsis. EXAM: CT ABDOMEN AND PELVIS WITHOUT CONTRAST TECHNIQUE: Multidetector CT imaging of the abdomen and pelvis was performed following the standard protocol without IV contrast. COMPARISON:  None. FINDINGS: Lower chest: No acute abnormality. Hepatobiliary: Status post cholecystectomy. No biliary dilatation is noted. Probable cyst seen in posterior segment of right hepatic  lobe. Pancreas: Unremarkable. No pancreatic ductal dilatation or surrounding inflammatory changes. Spleen: Normal in size without focal abnormality. Adrenals/Urinary Tract: Adrenal glands and kidneys are unremarkable. No hydronephrosis or renal obstruction is noted. No renal or ureteral calculi are noted. Mild wall thickening with minimal surrounding inflammatory changes is seen involving the urinary bladder suggesting cystitis. Stomach/Bowel: Stomach is within normal limits. Appendix appears normal. No evidence of bowel wall thickening, distention, or inflammatory changes. Vascular/Lymphatic: Aortic atherosclerosis. No enlarged abdominal or pelvic lymph nodes. Reproductive: Moderate prostatic enlargement is noted. Other: No abdominal wall hernia or abnormality. No abdominopelvic ascites. Musculoskeletal: No acute or significant osseous findings. IMPRESSION: Mild urinary bladder wall thickening is noted with minimal surrounding inflammatory changes consistent with cystitis. Clinical correlation is recommended. Moderate prostatic enlargement is noted. Aortic Atherosclerosis (ICD10-I70.0). Electronically Signed   By: Marijo Conception, M.D.   On: 08/22/2018 13:49    Assessment / Plan:   Assessment: 1.  Rectal bleeding: No further bleeding since admission, hemoglobin stable and increasing, 9.1 today, yesterday Dr. Rush Landmark and myself both discussed the option of patient proceeding with EGD and colonoscopy for further evaluation of his anemia while he is in the hospital, today patient declines and wishes to follow-up on this outpatient with his regular GI physician 2.  Normocytic anemia: Improved 3.  Sepsis/gram-negative bacteremia/UTI/pneumonia  Plan: 1.  Patient has decided that he does not want to proceed with endoscopic evaluation during this hopistalization.  Per Dr. Rush Landmark it is reasonable to restart his Plavix and watch him for another day or so to ensure  that he is not having any significant  development of anemia/progressive anemia and/or recurrence of his rectal bleeding 2.  For now we will sign off.  Please let us know if patient has further bleeding or signs of worsening anemia  Thank you for kind consultation.    LOS: 4 days   Levin Erp  08/23/2018, 11:17 AM

## 2018-08-23 NOTE — Progress Notes (Signed)
PROGRESS NOTE    Luis Bryan  WEX:[id number] DOB: [birth year] DOA: 08/19/2018 PCP: Maryella Shivers, MD    Brief Narrative: Luis Bryan is a 67 y.o. male with past medical history significant for hypertension, BPH, remote provoked PE, OSA not on CPAP, COPD, chronic kidney disease stage III, right TKR 11-04 at [facility], , complicated by urinary retention needing a Foley catheter. He follow-up with his urologist and the catheter was removed. He presents to [facility] on 11/21 2019 with dysuria, fever, chills. Evaluation at [facility] consistent with sepsis due to urinary tract infection and left lingular pneumonia, GNR bacteremia. Transfer from [facility] due to GI bleed. Patient develop blood per rectum.   Patient was admitted with sepsis secondary to E. coli bacteremia.  Secondary to urinary tract infection from Foley catheter.  Patient blood culture grew E. coli with intermediate resistance to ceftriaxone.  Urine culture also grew E. coli but with good sensitivity to ceftriaxone.  Due to difference in sensitivity and after discussion with infectious disease CT abdomen was order to rule out other source of E. coli bacteremia.  CT abdomen was negative for source of infection. Infectious disease is recommending a total of 7 days of antibiotics, from last negative blood cultures.  E. coli was resistant to all oral antibiotic options.  Patient will need a total of 7 days of IV cefepime, last dose November 30.  For the GI bleed he has remained stable.  He has decided not to undergo an endoscopy evaluation during this hospitalization.  Plan is to resume Plavix, and return for bleeding.   Assessment & Plan:   Active Problems:   COPD GOLD 0    Morbid obesity (HCC)   GI bleed   PNA (pneumonia)   Acute lower UTI   E coli bacteremia   Sepsis due to Escherichia coli (E. coli) (HCC)   1-Sepsis; due to E coli Bacteremia.  UTI He presented with hypotension,  leukocytosis, at prior facility. His BP improved. Sepsis secondary to UTI and PNA.  He was treated with levaquin started 11-21. - Treated initially with   Ceftriaxone.  -Appreciate pharmacist assistance with getting cultures from [place]. -Blood cultures from [facility]:  E. coli, with intermediate sensitive to Ceftriaxone. Change antibiotics to cefepime.  -Discussed with ID, recommend CT abdomen to rule out different source of infection due to difference E coli sensitivity.  -patient will needs 7 days of IV antibiotics after last negative blood cultures. E coli was resistant to all oral options. Last dose of IV antibiotics 11-30 -Blood culture 11-24 no growth to date.   2-UTI; with hematuria.  Related to prior foley catheter.  Monitor urine out put. Bladder scan.  Started on IV cefepime Will need 7 days of IV antibiotics from 11-24. Last blood culture done   3-Acute GI bleed. History of same.  Presents with blood per rectum, black stool.  IV fluids. Type and scren. Repeat hb blood transfusion as needed.  Received one unit PRBC 11-22,. No bleeding today, repeat hb in am.  IV protonix.   Hemoglobin remained stable. Patient has decide to hold on endoscopy evaluation for now.  Plan to resume plavix and monitor for bleeding.   4-AKI on CKD stage III; per report cr one year ago was at 1.3.  Check renal function.  Cr at [facility] was 2.2.--1.9  Improving.   5-PNA, lingula. Started  ceftriaxone and Zithromax.  Chest x ray here clear. Azithromycin stopped   6-urinary retention; he  was started on bethanechol 25 mg TID, Proscar 5 mg daily, doxazosin. Will await for med rec.  7-history of stroke. Hold plavix.   8-recent Right knee sx; hold aspirin.   9-chronic pain.  Resume MS Contin.  10-fibromyalgia continue with gabapentin  RN Pressure Injury Documentation:    Malnutrition Type:      Malnutrition Characteristics:      Nutrition Interventions:     Estimated body mass  index is 34.19 kg/m as calculated from the following:   Height as of this encounter: 6\' 2"  (1.88 m).   Weight as of this encounter: 120.8 kg.   DVT prophylaxis: scd Code Status: full code.  Family Communication: care discussed with patient Disposition Plan; home when stable.  Transfer to [place] now that he is hemodynamically stable  Consultants:   Gi    Procedures:   none   Antimicrobials: ceftriaxone    Subjective: No bloody stool. Only had small BM    Objective: Vitals:   08/22/18 0555 08/22/18 1340 08/22/18 2128 08/23/18 0616  BP: 125/68 (!) 141/74 125/73 140/78  Pulse: 68 (!) 51 71 68  Resp: 18 18 16 20   Temp: 98.4 F (36.9 C) 98 F (36.7 C) 98.3 F (36.8 C) 98.1 F (36.7 C)  TempSrc: Oral Oral Oral Oral  SpO2: 96% 99% 96% 97%  Weight:      Height:        Intake/Output Summary (Last 24 hours) at 08/23/2018 1346 Last data filed at 08/23/2018 0800 Gross per 24 hour  Intake 822.95 ml  Output -  Net 822.95 ml   Filed Weights   08/19/18 1731  Weight: 120.8 kg    Examination:  General exam: NAD Respiratory system: CTA Cardiovascular system: S 1, S 2 RRR Gastrointestinal system: BS resent, soft, nt Central nervous system: Non focal.  Extremities: Symmetric 5 x 5 power. Right knee no edema, no redness, no worsening pain, incision healing.  Skin: No rash      Data Reviewed: I have personally reviewed following labs and imaging studies  CBC: Recent Labs  Lab 08/19/18 1821 08/20/18 0257 08/20/18 0941 08/21/18 0729 08/22/18 0415 08/23/18 0738  WBC 7.6  --  5.5 5.1 4.6 5.4  HGB 7.8* 7.9* 8.5* 8.6* 8.2* 9.1*  HCT 25.6* 26.1* 28.0* 28.3* 26.9* 29.1*  MCV 95.5  --  94.6 92.5 94.7 90.1  PLT 190  --  198 233 199 275   Basic Metabolic Panel: Recent Labs  Lab 08/19/18 1821 08/20/18 0941 08/21/18 0729 08/22/18 0415  NA 139 138 142 142  K 3.8 3.7 3.8 3.5  CL 109 108 112* 113*  CO2 23 24 24  21*  GLUCOSE 105* 116* 106* 95  BUN 27* 17 14  12   CREATININE 1.52* 1.09 0.92 0.84  CALCIUM 8.1* 8.4* 8.6* 8.4*   GFR: Estimated Creatinine Clearance: 116.2 mL/min (by C-G formula based on SCr of 0.84 mg/dL). Liver Function Tests: Recent Labs  Lab 08/19/18 1821  AST 18  ALT 15  ALKPHOS 52  BILITOT 0.8  PROT 6.1*  ALBUMIN 3.0*   No results for input(s): LIPASE, AMYLASE in the last 168 hours. No results for input(s): AMMONIA in the last 168 hours. Coagulation Profile: No results for input(s): INR, PROTIME in the last 168 hours. Cardiac Enzymes: No results for input(s): CKTOTAL, CKMB, CKMBINDEX, TROPONINI in the last 168 hours. BNP (last 3 results) No results for input(s): PROBNP in the last 8760 hours. HbA1C: No results for input(s): HGBA1C in the last 72  hours. CBG: No results for input(s): GLUCAP in the last 168 hours. Lipid Profile: No results for input(s): CHOL, HDL, LDLCALC, TRIG, CHOLHDL, LDLDIRECT in the last 72 hours. Thyroid Function Tests: No results for input(s): TSH, T4TOTAL, FREET4, T3FREE, THYROIDAB in the last 72 hours. Anemia Panel: No results for input(s): VITAMINB12, FOLATE, FERRITIN, TIBC, IRON, RETICCTPCT in the last 72 hours. Sepsis Labs: No results for input(s): PROCALCITON, LATICACIDVEN in the last 168 hours.  Recent Results (from the past 240 hour(s))  MRSA PCR Screening     Status: None   Collection Time: 08/19/18  6:21 PM  Result Value Ref Range Status   MRSA by PCR NEGATIVE NEGATIVE Final    Comment:        The GeneXpert MRSA Assay (FDA approved for NASAL specimens only), is one component of a comprehensive MRSA colonization surveillance program. It is not intended to diagnose MRSA infection nor to guide or monitor treatment for MRSA infections. Performed at [facility], [place] [address], [place], [place] [address]   Urine Culture     Status: Abnormal   Collection Time: 08/19/18  6:22 PM  Result Value Ref Range Status   Specimen Description   Final    URINE,  CLEAN CATCH Performed at [facility], [place] [address] [place], [place] [address]    Special Requests   Final    NONE Performed at [facility], [place] [address], [place], [place] [address]    Culture (A)  Final    <10,000 COLONIES/mL INSIGNIFICANT GROWTH Performed at [facility] [address], [place], [place] [address]    Report Status 08/21/2018 FINAL  Final  Culture, blood (routine x 2)     Status: None (Preliminary result)   Collection Time: 08/21/18  7:29 AM  Result Value Ref Range Status   Specimen Description   Final    BLOOD RIGHT ANTECUBITAL Performed at [facility] [address], [place], [place] [address]    Special Requests   Final    BOTTLES DRAWN AEROBIC ONLY Blood Culture adequate volume Performed at [facility] [address], [place], [place] [address]    Culture   Final    NO GROWTH 2 DAYS Performed at [place] [address], [place], [place] [address]    Report Status PENDING  Incomplete  Culture, blood (routine x 2)     Status: None (Preliminary result)   Collection Time: 08/21/18  7:30 AM  Result Value Ref Range Status   Specimen Description   Final    BLOOD LEFT HAND Performed at [facility] [address], [place], [place] [address]    Special Requests   Final    BOTTLES DRAWN AEROBIC ONLY Blood Culture adequate volume Performed at [facility] [address], [place], [place] [address]    Culture   Final    NO GROWTH 2 DAYS Performed at [facility] [address] [place],  [address]    Report Status PENDING  Incomplete         Radiology Studies: Ct Abdomen Pelvis Wo Contrast  Result Date: 08/22/2018 CLINICAL DATA:  Sepsis. EXAM: CT ABDOMEN AND PELVIS WITHOUT CONTRAST TECHNIQUE: Multidetector CT imaging of the abdomen and pelvis was performed following the standard protocol without IV  contrast. COMPARISON:  None. FINDINGS: Lower chest: No acute abnormality. Hepatobiliary: Status post cholecystectomy. No biliary dilatation is noted. Probable cyst  seen in posterior segment of right hepatic lobe. Pancreas: Unremarkable. No pancreatic ductal dilatation or surrounding inflammatory changes. Spleen: Normal in size without focal abnormality. Adrenals/Urinary Tract: Adrenal glands and kidneys are unremarkable. No hydronephrosis or renal obstruction is noted. No renal or ureteral calculi are noted. Mild wall thickening with minimal surrounding inflammatory changes is seen involving the urinary bladder suggesting cystitis. Stomach/Bowel: Stomach is within normal limits. Appendix appears normal. No evidence of bowel wall thickening, distention, or inflammatory changes. Vascular/Lymphatic: Aortic atherosclerosis. No enlarged abdominal or pelvic lymph nodes. Reproductive: Moderate prostatic enlargement is noted. Other: No abdominal wall hernia or abnormality. No abdominopelvic ascites. Musculoskeletal: No acute or significant osseous findings. IMPRESSION: Mild urinary bladder wall thickening is noted with minimal surrounding inflammatory changes consistent with cystitis. Clinical correlation is recommended. Moderate prostatic enlargement is noted. Aortic Atherosclerosis (ICD10-I70.0). Electronically Signed   By: Marijo Conception, M.D.   On: 08/22/2018 13:49        Scheduled Meds: . bethanechol  25 mg Oral TID  . clopidogrel  75 mg Oral Daily  . finasteride  5 mg Oral Daily  . gabapentin  1,200 mg Oral TID  . metoprolol tartrate  25 mg Oral QHS  . morphine  15 mg Oral Q8H  . pantoprazole (PROTONIX) IV  40 mg Intravenous Q12H  . polyethylene glycol  17 g Oral Daily  . primidone  200 mg Oral QHS  . rosuvastatin  10 mg Oral QHS  . sodium chloride flush  3 mL Intravenous Q12H   Continuous Infusions: . ceFEPime (MAXIPIME) IV 2 g (08/23/18 1310)     LOS: 4 days    Time spent: 35 minutes.      Elmarie Shiley, MD [facility] Pager [phone]  If 7PM-7AM, please contact night-coverage [email or web address] Password [email or web address] 08/23/2018, 1:46 PM

## 2018-08-24 DIAGNOSIS — K625 Hemorrhage of anus and rectum: Secondary | ICD-10-CM

## 2018-08-24 LAB — CBC
HCT: 29.2 % — ABNORMAL LOW (ref 39.0–52.0)
Hemoglobin: 9 g/dL — ABNORMAL LOW (ref 13.0–17.0)
MCH: 28.5 pg (ref 26.0–34.0)
MCHC: 30.8 g/dL (ref 30.0–36.0)
MCV: 92.4 fL (ref 80.0–100.0)
NRBC: 0 % (ref 0.0–0.2)
PLATELETS: 226 10*3/uL (ref 150–400)
RBC: 3.16 MIL/uL — AB (ref 4.22–5.81)
RDW: 14.3 % (ref 11.5–15.5)
WBC: 6.2 10*3/uL (ref 4.0–10.5)

## 2018-08-24 LAB — BASIC METABOLIC PANEL
Anion gap: 7 (ref 5–15)
BUN: 10 mg/dL (ref 8–23)
CALCIUM: 8.7 mg/dL — AB (ref 8.9–10.3)
CO2: 26 mmol/L (ref 22–32)
Chloride: 109 mmol/L (ref 98–111)
Creatinine, Ser: 0.77 mg/dL (ref 0.61–1.24)
GFR calc Af Amer: >60 mL/min (ref >60)
GLUCOSE: 109 mg/dL — AB (ref 70–99)
POTASSIUM: 3.5 mmol/L (ref 3.5–5.1)
SODIUM: 142 mmol/L (ref 135–145)

## 2018-08-24 NOTE — Progress Notes (Signed)
PROGRESS NOTE    Luis Bryan  UVO:[id number] DOB: 1951/07/01 DOA: 08/19/2018 PCP: Maryella Shivers, MD    Brief Narrative: Luis Bryan is a 67 y.o. male with past medical history significant for hypertension, BPH, remote provoked PE, OSA not on CPAP, COPD, chronic kidney disease stage III, right TKR 11-04 at [facility], , complicated by urinary retention needing a Foley catheter. He follow-up with his urologist and the catheter was removed. He presented to [facility] on 08/18/2018 with dysuria, fever, chills. Evaluation at [facility] consistent with sepsis due to urinary tract infection and left lingular pneumonia, GNR bacteremia. Transferred from [facility] hospital due to GI bleed. Patient developed blood per rectum.   Patient was admitted with sepsis secondary to E. coli bacteremia secondary to urinary tract infection from Foley catheter.  Patient blood culture grew E. coli with intermediate resistance to ceftriaxone. Urine culture also grew E. coli but with good sensitivity to ceftriaxone. Due to difference in sensitivity and after discussion with infectious disease CT abdomen was order to rule out other source of E. coli bacteremia.  CT abdomen was negative for source of infection. Infectious disease recommended a total of 7 days of antibiotics, from last negative blood cultures.  E. coli was resistant to all oral antibiotic options.  Patient will need a total of 7 days of IV cefepime, last dose November 30.  For the GI bleed he has remained stable.  He has decided not to undergo an endoscopy evaluation during this hospitalization.  Plan is to resume Plavix, and return for bleeding.   Assessment & Plan:   Active Problems:   COPD GOLD 0    Morbid obesity (HCC)   GI bleed   PNA (pneumonia)   Acute lower UTI   E coli bacteremia   Sepsis due to Escherichia coli (E. coli) (HCC)   Sepsis due to E coli Bacteremia and UTI He presented with hypotension,  leukocytosis, at prior facility.  He was treated with levaquin started 11-21. - Treated initially with Ceftriaxone.  - Appreciate pharmacist assistance with getting cultures from [place]. - Blood cultures from [place]:  E. coli, with intermediate sensitive to Ceftriaxone. Changed antibiotics to cefepime.  - Dr. Tyrell Antonio discussed with ID, recommended CT abdomen to rule out different source of infection due to difference E coli sensitivity. CT w/o acute findings. - As per ID, patient will needs 7 days of IV antibiotics after last negative blood cultures. E coli was resistant to all oral options. Last dose of IV antibiotics 11-30 - Blood culture 11-24 no growth to date.   UTI; with hematuria.  Related to prior foley catheter.  Started on IV cefepime Will need 7 days of IV antibiotics from 11-24.   Acute GI bleed. History of same.  Presented with blood per rectum, black stool.  Received one unit PRBC 11-22,. No further bleeding noted.  IV protonix.   Hemoglobin remained stable. Patient has decide to hold on endoscopy evaluation for now.  Plan to resume plavix and monitor for bleeding.  [facility] seen and signed off 11/27.  Outpatient follow-up with primary gastroenterologist.  AKI on CKD stage III; per report cr one year ago was at 1.3.  Cr at [facility] was 2.2.--1.9  Creatinine has normalized.  Acute kidney injury resolved.  PNA, lingula.  Started  ceftriaxone and Zithromax.  Chest x ray here clear. Azithromycin stopped  Possibly completed treatment for this indication.  Urinary retention;  he was started on bethanechol 25 mg  TID, Proscar 5 mg daily, doxazosin.   History of stroke.   Clopidogrel resumed.  Monitor for bleeding.  Recent Right knee sx;  hold aspirin, ? Need to resume.   Chronic pain.   Resumed MS Contin.Controlled  Fibromyalgia  continue with gabapentin & MS Contin.    DVT prophylaxis: scd Code Status: full code.  Family Communication: None at  bedside. Disposition Plan; DC home likely after completion of IV antibiotics on 11/30.  Consultants:   [facility]   Procedures:   None   Antimicrobials:  IV cefepime   Subjective: Reports that he feels okay.  Denies complaints.  No pain or rectal bleeding.  As per RN, no acute issues noted.   Objective: Vitals:   08/23/18 1500 08/23/18 2203 08/24/18 0610 08/24/18 1336  BP: 128/78 133/76 138/77 127/77  Pulse:  65 67 73  Resp:  17 18 20   Temp:  98.6 F (37 C) 98.3 F (36.8 C) 98.2 F (36.8 C)  TempSrc:    Oral  SpO2:  95% 94% 97%  Weight:      Height:        Intake/Output Summary (Last 24 hours) at 08/24/2018 1844 Last data filed at 08/24/2018 1708 Gross per 24 hour  Intake 1500.78 ml  Output 750 ml  Net 750.78 ml   Filed Weights   08/19/18 1731  Weight: 120.8 kg    Examination:  General exam: Pleasant middle-aged male, moderately built and obese lying comfortably propped up in bed. Respiratory system: Clear to auscultation.  No increased work of breathing. Cardiovascular system: S 1, S 2 RRR.  No JVD, murmurs or pedal edema. Gastrointestinal system: Abdomen is nondistended, soft and nontender.  Normal bowel sounds heard. Central nervous system: Alert and oriented x3.  No focal neurological deficits. Extremities: Symmetric 5 x 5 power. Right knee no edema, no redness, no worsening pain, incision healing.  Skin: No rash      Data Reviewed: I have personally reviewed following labs and imaging studies  CBC: Recent Labs  Lab 08/20/18 0941 08/21/18 0729 08/22/18 0415 08/23/18 0738 08/24/18 0629  WBC 5.5 5.1 4.6 5.4 6.2  HGB 8.5* 8.6* 8.2* 9.1* 9.0*  HCT 28.0* 28.3* 26.9* 29.1* 29.2*  MCV 94.6 92.5 94.7 90.1 92.4  PLT 198 233 199 235 503   Basic Metabolic Panel: Recent Labs  Lab 08/19/18 1821 08/20/18 0941 08/21/18 0729 08/22/18 0415 08/24/18 0629  NA 139 138 142 142 142  K 3.8 3.7 3.8 3.5 3.5  CL 109 108 112* 113* 109  CO2 23 24 24   21* 26  GLUCOSE 105* 116* 106* 95 109*  BUN 27* 17 14 12 10   CREATININE 1.52* 1.09 0.92 0.84 0.77  CALCIUM 8.1* 8.4* 8.6* 8.4* 8.7*   GFR: Estimated Creatinine Clearance: 122 mL/min (by C-G formula based on SCr of 0.77 mg/dL). Liver Function Tests: Recent Labs  Lab 08/19/18 1821  AST 18  ALT 15  ALKPHOS 52  BILITOT 0.8  PROT 6.1*  ALBUMIN 3.0*    Recent Results (from the past 240 hour(s))  MRSA PCR Screening     Status: None   Collection Time: 08/19/18  6:21 PM  Result Value Ref Range Status   MRSA by PCR NEGATIVE NEGATIVE Final    Comment:        The GeneXpert MRSA Assay (FDA approved for NASAL specimens only), is one component of a comprehensive MRSA colonization surveillance program. It is not intended to diagnose MRSA infection nor to guide or  monitor treatment for MRSA infections. Performed at [facility], [place] [address] [place], [place] [address]   Urine Culture     Status: Abnormal   Collection Time: 08/19/18  6:22 PM  Result Value Ref Range Status   Specimen Description   Final    URINE, CLEAN CATCH Performed at [facility], [place] [address] [place], [place] [address]    Special Requests   Final    NONE Performed at [facility], [place] [address], [place], [place] [address]    Culture (A)  Final    <10,000 COLONIES/mL INSIGNIFICANT GROWTH Performed at [facility] [address] [place], [place] [address]    Report Status 08/21/2018 FINAL  Final  Culture, blood (routine x 2)     Status: None (Preliminary result)   Collection Time: 08/21/18  7:29 AM  Result Value Ref Range Status   Specimen Description   Final    BLOOD RIGHT ANTECUBITAL Performed at [facility] [address], [place], [place] [address]    Special Requests   Final    BOTTLES DRAWN AEROBIC ONLY Blood Culture adequate volume Performed at [facility] [address]  [place], [place] [address]    Culture   Final    NO GROWTH 3 DAYS Performed at [facility], [place] [address], [place], [place] [address]    Report Status PENDING  Incomplete  Culture, blood (routine x 2)     Status: None (Preliminary result)   Collection Time: 08/21/18  7:30 AM  Result Value Ref Range Status   Specimen Description   Final    BLOOD LEFT HAND Performed at [facility] [address], [place], [place] [address]    Special Requests   Final    BOTTLES DRAWN AEROBIC ONLY Blood Culture adequate volume Performed at [facility] [address] [place], [place] [address]    Culture   Final    NO GROWTH 3 DAYS Performed at [facility], [place] [address] [place], [place] [address]    Report Status PENDING  Incomplete         Radiology Studies: No results found.      Scheduled Meds: . bethanechol  25 mg Oral TID  . clopidogrel  75 mg Oral Daily  . finasteride  5 mg Oral Daily  . gabapentin  1,200 mg Oral TID  . metoprolol tartrate  25 mg Oral QHS  . morphine  15 mg Oral Q8H  . pantoprazole (PROTONIX) IV  40 mg Intravenous Q12H  . polyethylene glycol  17 g Oral Daily  . primidone  200 mg Oral QHS  . rosuvastatin  10 mg Oral QHS  . sodium chloride flush  3 mL Intravenous Q12H   Continuous Infusions: . ceFEPime (MAXIPIME) IV Stopped (08/24/18 1350)     LOS: 5 days    Vernell Leep, MD, FACP, [facility]. [facility] Pager [phone]  If 7PM-7AM, please contact night-coverage [email or web address] Password [facility] 08/24/2018, 6:54 PM

## 2018-08-25 MED ORDER — FENOFIBRATE 160 MG PO TABS
160.0000 mg | ORAL_TABLET | Freq: Every day | ORAL | Status: DC
  Administered 2018-08-25 – 2018-08-26 (×2): 160 mg via ORAL
  Filled 2018-08-25 (×2): qty 1

## 2018-08-25 MED ORDER — FUROSEMIDE 20 MG PO TABS: 20.0000 mg | ORAL_TABLET | ORAL | Status: DC

## 2018-08-25 MED ORDER — FUROSEMIDE 20 MG PO TABS
20.0000 mg | ORAL_TABLET | ORAL | Status: DC
  Administered 2018-08-25 – 2018-08-27 (×3): 20 mg via ORAL
  Filled 2018-08-25 (×3): qty 1

## 2018-08-25 MED ORDER — SENNA 8.6 MG PO TABS
2.0000 | ORAL_TABLET | Freq: Every day | ORAL | Status: DC
  Administered 2018-08-25: 17.2 mg via ORAL
  Filled 2018-08-25: qty 2

## 2018-08-25 MED ORDER — SENNA 8.6 MG PO TABS
1.0000 | ORAL_TABLET | ORAL | Status: DC
  Administered 2018-08-27: 8.6 mg via ORAL
  Filled 2018-08-25: qty 1

## 2018-08-25 MED ORDER — POLYSACCHARIDE IRON COMPLEX 150 MG PO CAPS
150.0000 mg | ORAL_CAPSULE | ORAL | Status: DC
  Administered 2018-08-26 – 2018-08-27 (×2): 150 mg via ORAL
  Filled 2018-08-25 (×2): qty 1

## 2018-08-25 NOTE — Progress Notes (Signed)
PROGRESS NOTE    Luis Bryan  LFY:[id number] DOB: 1951-01-12 DOA: 08/19/2018 PCP: Maryella Shivers, MD    Brief Narrative: Luis Bryan is a 67 y.o. male with past medical history significant for hypertension, BPH, remote provoked PE, OSA not on CPAP, COPD, chronic kidney disease stage III, right TKR 11-04 at [facility], complicated by urinary retention needing a Foley catheter. He followed up with his urologist and the catheter was removed. He presented to [facility] on 08/18/2018 with dysuria, fever, chills. Evaluation at [facility] consistent with sepsis due to urinary tract infection and left lingular pneumonia, GNR bacteremia. Transferred from [facility] hospital due to GI bleed/BRBPR.   Patient was admitted with sepsis secondary to E. coli bacteremia secondary to urinary tract infection from Foley catheter. Patient blood culture grew E. coli with intermediate resistance to Ceftriaxone. Urine culture also grew E. coli but with good sensitivity to ceftriaxone. Due to difference in sensitivity and after discussion with infectious disease CT abdomen was ordered to rule out other source of E. coli bacteremia.  CT abdomen was negative for source of infection. Infectious disease recommended a total of 7 days of antibiotics, from last negative blood cultures. E. coli was resistant to all oral antibiotic options. Patient will need a total of 7 days of IV cefepime, last dose November 30.  The GI bleed he has remained stable.  He has decided not to undergo an endoscopy evaluation during this hospitalization.  Plavix resumed.   Assessment & Plan:   Active Problems:   COPD GOLD 0    Morbid obesity (HCC)   GI bleed   PNA (pneumonia)   Acute lower UTI   E coli bacteremia   Sepsis due to Escherichia coli (E. coli) (HCC)   Sepsis due to E coli Bacteremia and UTI He presented with hypotension, leukocytosis, at prior facility.  He was treated with levaquin started 11-21. -  Treated initially with Ceftriaxone.  - Blood cultures from [place]:  E. coli, with intermediate sensitive to Ceftriaxone. Changed antibiotics to cefepime.  - Dr. Tyrell Antonio discussed with ID on 11/25, recommended CT abdomen to rule out different source of infection due to difference E coli sensitivity. CT w/o acute findings. - As per ID, patient will needs 7 days of IV antibiotics after last negative blood cultures. E coli was resistant to all oral options. Last dose of IV antibiotics 11-30 - Blood culture 11-24 no growth to date 4 days.  Urine culture 11/22: Insignificant growth.  UTI; with hematuria.  Related to prior foley catheter.  Started on IV cefepime Will need 7 days of IV antibiotics from 11-24.   Acute GI bleed. History of same.  Presented with blood per rectum, black stool.  Received one unit PRBC 11-22,. No further bleeding noted.  IV protonix.   Hemoglobin remained stable. Patient has decide to hold on endoscopy evaluation for now.  Plavix has been resumed and monitor for bleeding.  [facility] seen and signed off 11/27.  Outpatient follow-up with primary gastroenterologist in [place].  AKI on CKD stage III; per report cr one year ago was at 1.3.  Cr at [place] was 2.2.--1.9  Creatinine has normalized.  Acute kidney injury resolved.  PNA, lingula.  Started  ceftriaxone and Zithromax.  Chest x ray here clear. Azithromycin stopped  Possibly completed treatment for this indication.  Urinary retention;  he was started on bethanechol 25 mg TID, Proscar 5 mg daily, doxazosin.   History of stroke.   Clopidogrel  resumed and has been on this for a long time.  Recently started on aspirin after knee surgery on 11/4 for postop DVT prophylaxis for a month.  Aspirin currently on hold.  Monitor for bleeding.  Recent Right knee sx;  hold aspirin, ? Need to resume.   Follow-up with orthopedic surgeon in [place] following discharge.  Chronic pain.   Resumed MS  Contin.Controlled  Fibromyalgia  continue with gabapentin & MS Contin.    DVT prophylaxis: scd Code Status: full code.  Family Communication: None at bedside. Disposition Plan; DC home likely after completion of IV antibiotics on 11/30.  Consultants:   [facility]   Procedures:   None   Antimicrobials:  IV cefepime   Subjective: Denies complaints.  Wants his senna changed to 1 tablet every other day like he takes at home.  Subsequently reported by RN that patient and spouse noted increase of swelling in his legs and feet.   Objective: Vitals:   08/24/18 1336 08/24/18 2102 08/25/18 0456 08/25/18 1447  BP: 127/77 (!) 146/87 (!) 141/82 139/73  Pulse: 73 63 70 64  Resp: 20 20 18 20   Temp: 98.2 F (36.8 C) 98.1 F (36.7 C) 98.1 F (36.7 C) 98.3 F (36.8 C)  TempSrc: Oral Oral Oral Oral  SpO2: 97% 97% 96% 99%  Weight:      Height:        Intake/Output Summary (Last 24 hours) at 08/25/2018 1544 Last data filed at 08/25/2018 0823 Gross per 24 hour  Intake 740 ml  Output -  Net 740 ml   Filed Weights   08/19/18 1731  Weight: 120.8 kg    Examination: No significant change in clinical exam compared to yesterday.  General exam: Pleasant middle-aged male, moderately built and obese lying comfortably propped up in bed. Respiratory system: Clear to auscultation.  No increased work of breathing. Cardiovascular system: S 1, S 2 RRR.  No JVD, murmurs or pedal edema. Gastrointestinal system: Abdomen is nondistended, soft and nontender.  Normal bowel sounds heard. Central nervous system: Alert and oriented x3.  No focal neurological deficits. Extremities: Symmetric 5 x 5 power. Right knee no edema, no redness, no worsening pain, incision healing.  Skin: No rash      Data Reviewed: I have personally reviewed following labs and imaging studies  CBC: Recent Labs  Lab 08/20/18 0941 08/21/18 0729 08/22/18 0415 08/23/18 0738 08/24/18 0629  WBC 5.5 5.1 4.6 5.4  6.2  HGB 8.5* 8.6* 8.2* 9.1* 9.0*  HCT 28.0* 28.3* 26.9* 29.1* 29.2*  MCV 94.6 92.5 94.7 90.1 92.4  PLT 198 233 199 235 683   Basic Metabolic Panel: Recent Labs  Lab 08/19/18 1821 08/20/18 0941 08/21/18 0729 08/22/18 0415 08/24/18 0629  NA 139 138 142 142 142  K 3.8 3.7 3.8 3.5 3.5  CL 109 108 112* 113* 109  CO2 23 24 24  21* 26  GLUCOSE 105* 116* 106* 95 109*  BUN 27* 17 14 12 10   CREATININE 1.52* 1.09 0.92 0.84 0.77  CALCIUM 8.1* 8.4* 8.6* 8.4* 8.7*   GFR: Estimated Creatinine Clearance: 122 mL/min (by C-G formula based on SCr of 0.77 mg/dL). Liver Function Tests: Recent Labs  Lab 08/19/18 1821  AST 18  ALT 15  ALKPHOS 52  BILITOT 0.8  PROT 6.1*  ALBUMIN 3.0*    Recent Results (from the past 240 hour(s))  MRSA PCR Screening     Status: None   Collection Time: 08/19/18  6:21 PM  Result Value Ref  Range Status   MRSA by PCR NEGATIVE NEGATIVE Final    Comment:        The GeneXpert MRSA Assay (FDA approved for NASAL specimens only), is one component of a comprehensive MRSA colonization surveillance program. It is not intended to diagnose MRSA infection nor to guide or monitor treatment for MRSA infections. Performed at [facility], [place] [address] [place], [place] [address]   Urine Culture     Status: Abnormal   Collection Time: 08/19/18  6:22 PM  Result Value Ref Range Status   Specimen Description   Final    URINE, CLEAN CATCH Performed at [facility], [place] [address], [place], [place] [address]    Special Requests   Final    NONE Performed at [facility], [place] [address] [place], [place] [address]    Culture (A)  Final    <10,000 COLONIES/mL INSIGNIFICANT GROWTH Performed at [place] [address] [place], [place] [address]    Report Status 08/21/2018 FINAL  Final  Culture, blood (routine x 2)     Status: None (Preliminary result)   Collection Time: 08/21/18  7:29 AM  Result  Value Ref Range Status   Specimen Description   Final    BLOOD RIGHT ANTECUBITAL Performed at [place] [address], [place], [place] [address]    Special Requests   Final    BOTTLES DRAWN AEROBIC ONLY Blood Culture adequate volume Performed at [place] [address] [place], [place] [address]    Culture   Final    NO GROWTH 4 DAYS Performed at [facility], [place] [address], [place], [place] [address]    Report Status PENDING  Incomplete  Culture, blood (routine x 2)     Status: None (Preliminary result)   Collection Time: 08/21/18  7:30 AM  Result Value Ref Range Status   Specimen Description   Final    BLOOD LEFT HAND Performed at [place] [address], [place], [place] [address]    Special Requests   Final    BOTTLES DRAWN AEROBIC ONLY Blood Culture adequate volume Performed at [place] [address], [place], [place] [address]    Culture   Final    NO GROWTH 4 DAYS Performed at [facility], [place] [address] [place], [place] [address]    Report Status PENDING  Incomplete         Radiology Studies: No results found.      Scheduled Meds: . bethanechol  25 mg Oral TID  . clopidogrel  75 mg Oral Daily  . finasteride  5 mg Oral Daily  . gabapentin  1,200 mg Oral TID  . metoprolol tartrate  25 mg Oral QHS  . morphine  15 mg Oral Q8H  . pantoprazole (PROTONIX) IV  40 mg Intravenous Q12H  . primidone  200 mg Oral QHS  . rosuvastatin  10 mg Oral QHS  . senna  2 tablet Oral Daily  . sodium chloride flush  3 mL Intravenous Q12H   Continuous Infusions: . ceFEPime (MAXIPIME) IV 2 g (08/25/18 1417)     LOS: 6 days    Vernell Leep, MD, FACP, [facility]. [facility] Pager [phone]  If 7PM-7AM, please contact night-coverage [email or web address] Password [facility] 08/25/2018, 3:44 PM

## 2018-08-26 LAB — CULTURE, BLOOD (ROUTINE X 2)
CULTURE: NO GROWTH
Culture: NO GROWTH
SPECIAL REQUESTS: ADEQUATE
SPECIAL REQUESTS: ADEQUATE

## 2018-08-26 LAB — BASIC METABOLIC PANEL
Anion gap: 6 (ref 5–15)
BUN: 10 mg/dL (ref 8–23)
CO2: 29 mmol/L (ref 22–32)
Calcium: 8.7 mg/dL — ABNORMAL LOW (ref 8.9–10.3)
Chloride: 106 mmol/L (ref 98–111)
Creatinine, Ser: 0.78 mg/dL (ref 0.61–1.24)
GFR calc Af Amer: >60 mL/min (ref >60)
GFR calc non Af Amer: >60 mL/min (ref >60)
Glucose, Bld: 112 mg/dL — ABNORMAL HIGH (ref 70–99)
Potassium: 3.5 mmol/L (ref 3.5–5.1)
Sodium: 141 mmol/L (ref 135–145)

## 2018-08-26 MED ORDER — PANTOPRAZOLE SODIUM 40 MG PO TBEC
40.0000 mg | DELAYED_RELEASE_TABLET | Freq: Every day | ORAL | Status: DC
  Administered 2018-08-26 – 2018-08-27 (×2): 40 mg via ORAL
  Filled 2018-08-26 (×2): qty 1

## 2018-08-26 NOTE — Care Management Important Message (Signed)
Important Message  Patient Details  Name: Mavin Dyke MRN: [id number] Date of Birth: 03/23/1951   Medicare Important Message Given:  Yes    Kerin Salen 08/26/2018, 10:31 AMImportant Message  Patient Details  Name: Niko Penson MRN: [id number] Date of Birth: [birth year]   Medicare Important Message Given:  Yes    Kerin Salen 08/26/2018, 10:31 AM

## 2018-08-26 NOTE — Progress Notes (Signed)
PROGRESS NOTE    Luis Bryan  WVP:[id number] DOB: 1951/02/10 DOA: 08/19/2018 PCP: Maryella Shivers, MD    Brief Narrative: Luis Bryan is a 67 y.o. male with past medical history significant for hypertension, BPH, remote provoked PE, OSA not on CPAP, COPD, chronic kidney disease stage III, right TKR 11-04 at [facility], complicated by urinary retention needing a Foley catheter. He followed up with his urologist and the catheter was removed. He presented to [facility] on 08/18/2018 with dysuria, fever, chills. Evaluation at [facility] consistent with sepsis due to urinary tract infection and left lingular pneumonia, GNR bacteremia. Transferred from [facility] hospital due to GI bleed/BRBPR.   Patient was admitted with sepsis secondary to E. coli bacteremia secondary to urinary tract infection from Foley catheter. Patient blood culture grew E. coli with intermediate resistance to Ceftriaxone. Urine culture also grew E. coli but with good sensitivity to ceftriaxone. Due to difference in sensitivity and after discussion with infectious disease CT abdomen was ordered to rule out other source of E. coli bacteremia.  CT abdomen was negative for source of infection. Infectious disease recommended a total of 7 days of antibiotics, from last negative blood cultures. E. coli was resistant to all oral antibiotic options. Patient will need a total of 7 days of IV cefepime, last dose November 30.  The GI bleed he has remained stable.  He has decided not to undergo an endoscopy evaluation during this hospitalization.  Plavix resumed.   Assessment & Plan:   Active Problems:   COPD GOLD 0    Morbid obesity (HCC)   GI bleed   PNA (pneumonia)   Acute lower UTI   E coli bacteremia   Sepsis due to Escherichia coli (E. coli) (HCC)   Sepsis due to E coli Bacteremia and UTI He presented with hypotension, leukocytosis, at prior facility.  He was treated with levaquin started 11-21. -  Treated initially with Ceftriaxone.  - Blood cultures from [place]:  E. coli, with intermediate sensitive to Ceftriaxone. Changed antibiotics to cefepime.  - Dr. Tyrell Antonio discussed with ID on 11/25, recommended CT abdomen to rule out different source of infection due to difference E coli sensitivity. CT w/o acute findings. - As per ID, patient will needs 7 days of IV antibiotics after last negative blood cultures. E coli was resistant to all oral options. Last dose of IV antibiotics 11-30 - Blood culture 11-24 no growth, final report.  Urine culture 11/22: Insignificant growth.  UTI; with hematuria.  Related to prior foley catheter.  Started on IV cefepime Will need 7 days of IV antibiotics from 11-24.   Acute GI bleed. History of same.  Presented with blood per rectum, black stool.  Received one unit PRBC 11-22,. No further bleeding noted.  IV protonix.   Hemoglobin remained stable. Patient has decide to hold on endoscopy evaluation for now.  Plavix has been resumed and monitor for bleeding.  [facility] seen and signed off 11/27.  Outpatient follow-up with primary gastroenterologist in [place].  AKI on CKD stage III; per report cr one year ago was at 1.3.  Cr at [facility] was 2.2.--1.9  Creatinine has normalized.  Acute kidney injury resolved.  PNA, lingula.  Started  ceftriaxone and Zithromax.  Chest x ray here clear. Azithromycin stopped  Possibly completed treatment for this indication.  Urinary retention;  he was started on bethanechol 25 mg TID, Proscar 5 mg daily, doxazosin.   History of stroke.   Clopidogrel resumed and  has been on this for a long time PTA.  Recently started on aspirin after knee surgery on 11/4 for postop DVT prophylaxis for a month.  Aspirin currently on hold d/t concern for recurrent bleeding from DAPT.  Monitor for bleeding.  Recent Right knee sx;  Holding ASA.   Follow-up with orthopedic surgeon in [place] following discharge.  Chronic pain.     Resumed MS Contin.Controlled  Fibromyalgia  continue with gabapentin & MS Contin.  LE edema Patient was on chronic Lasix at home which had been held in the hospital.  Resumed yesterday and edema has almost resolved.    DVT prophylaxis: scd Code Status: full code.  Family Communication: None at bedside. Disposition Plan; DC home likely after completion of IV antibiotics on 11/30.  Consultants:   [facility]   Procedures:   None   Antimicrobials:  IV cefepime   Subjective: No complaints reported.  Showered yesterday.  Ambulating without difficulty.  Leg/feet swelling almost resolved.   Objective: Vitals:   08/26/18 0508 08/26/18 0745 08/26/18 1409 08/26/18 1500  BP: (!) 143/89 138/82 132/73   Pulse: 64  72   Resp: 16  20   Temp: 98.3 F (36.8 C)  (!) 97.4 F (36.3 C) 98 F (36.7 C)  TempSrc: Oral  Oral Oral  SpO2: 100%  100%   Weight:      Height:        Intake/Output Summary (Last 24 hours) at 08/26/2018 1759 Last data filed at 08/26/2018 1500 Gross per 24 hour  Intake 2367.49 ml  Output 2050 ml  Net 317.49 ml   Filed Weights   08/19/18 1731  Weight: 120.8 kg    Examination:   General exam: Pleasant middle-aged male, moderately built and obese lying comfortably propped up in bed. Respiratory system: Clear to auscultation.  No increased work of breathing. Cardiovascular system: S 1, S 2 RRR.  No JVD, murmurs. Very trace ankle edema. Gastrointestinal system: Abdomen is nondistended, soft and nontender.  Normal bowel sounds heard. Central nervous system: Alert and oriented x3.  No focal neurological deficits. Extremities: Symmetric 5 x 5 power. Right knee no edema, no redness, no worsening pain, incision healing.  Skin: No rash      Data Reviewed: I have personally reviewed following labs and imaging studies  CBC: Recent Labs  Lab 08/20/18 0941 08/21/18 0729 08/22/18 0415 08/23/18 0738 08/24/18 0629  WBC 5.5 5.1 4.6 5.4 6.2  HGB 8.5*  8.6* 8.2* 9.1* 9.0*  HCT 28.0* 28.3* 26.9* 29.1* 29.2*  MCV 94.6 92.5 94.7 90.1 92.4  PLT 198 233 199 235 742   Basic Metabolic Panel: Recent Labs  Lab 08/20/18 0941 08/21/18 0729 08/22/18 0415 08/24/18 0629 08/26/18 0650  NA 138 142 142 142 141  K 3.7 3.8 3.5 3.5 3.5  CL 108 112* 113* 109 106  CO2 24 24 21* 26 29  GLUCOSE 116* 106* 95 109* 112*  BUN 17 14 12 10 10   CREATININE 1.09 0.92 0.84 0.77 0.78  CALCIUM 8.4* 8.6* 8.4* 8.7* 8.7*   GFR: Estimated Creatinine Clearance: 122 mL/min (by C-G formula based on SCr of 0.78 mg/dL). Liver Function Tests: Recent Labs  Lab 08/19/18 1821  AST 18  ALT 15  ALKPHOS 52  BILITOT 0.8  PROT 6.1*  ALBUMIN 3.0*    Recent Results (from the past 240 hour(s))  MRSA PCR Screening     Status: None   Collection Time: 08/19/18  6:21 PM  Result Value Ref Range Status  MRSA by PCR NEGATIVE NEGATIVE Final    Comment:        The GeneXpert MRSA Assay (FDA approved for NASAL specimens only), is one component of a comprehensive MRSA colonization surveillance program. It is not intended to diagnose MRSA infection nor to guide or monitor treatment for MRSA infections. Performed at [facility], [place] [address], [place], [place] [address]   Urine Culture     Status: Abnormal   Collection Time: 08/19/18  6:22 PM  Result Value Ref Range Status   Specimen Description   Final    URINE, CLEAN CATCH Performed at [facility], [place] [address], [place], [place] [address]    Special Requests   Final    NONE Performed at [facility], [place] [address] [place], [place] [address]    Culture (A)  Final    <10,000 COLONIES/mL INSIGNIFICANT GROWTH Performed at [place] [address] [place], [place] [address]    Report Status 08/21/2018 FINAL  Final  Culture, blood (routine x 2)     Status: None   Collection Time: 08/21/18  7:29 AM  Result Value Ref Range Status   Specimen  Description   Final    BLOOD RIGHT ANTECUBITAL Performed at [place] [address], [place], [place] [address]    Special Requests   Final    BOTTLES DRAWN AEROBIC ONLY Blood Culture adequate volume Performed at [place] [address] [place], [place] [address]    Culture   Final    NO GROWTH 5 DAYS Performed at [facility], [place] [address] [place], [place] [address]    Report Status 08/26/2018 FINAL  Final  Culture, blood (routine x 2)     Status: None   Collection Time: 08/21/18  7:30 AM  Result Value Ref Range Status   Specimen Description   Final    BLOOD LEFT HAND Performed at [place] [address], [place], [place] [address]    Special Requests   Final    BOTTLES DRAWN AEROBIC ONLY Blood Culture adequate volume Performed at [place] [address], [place], [place] [address]    Culture   Final    NO GROWTH 5 DAYS Performed at [facility], [place] [address], [place], [place] [address]    Report Status 08/26/2018 FINAL  Final         Radiology Studies: No results found.      Scheduled Meds: . bethanechol  25 mg Oral TID  . clopidogrel  75 mg Oral Daily  . fenofibrate  160 mg Oral QHS  . finasteride  5 mg Oral Daily  . furosemide  20 mg Oral BH-q7a  . gabapentin  1,200 mg Oral TID  . iron polysaccharides  150 mg Oral BH-q7a  . metoprolol tartrate  25 mg Oral QHS  . morphine  15 mg Oral Q8H  . pantoprazole  40 mg Oral Daily  . primidone  200 mg Oral QHS  . rosuvastatin  10 mg Oral QHS  . [START ON 08/27/2018] senna  1 tablet Oral QODAY  . sodium chloride flush  3 mL Intravenous Q12H   Continuous Infusions: . ceFEPime (MAXIPIME) IV Stopped (08/26/18 1337)     LOS: 7 days    Vernell Leep, MD, FACP, [facility]. [facility] Pager [phone]  If 7PM-7AM, please contact night-coverage [email or web address] Password [email or web address] 08/26/2018, 5:59 PM

## 2018-08-27 DIAGNOSIS — E785 Hyperlipidemia, unspecified: Secondary | ICD-10-CM | POA: Diagnosis not present

## 2018-08-27 DIAGNOSIS — K922 Gastrointestinal hemorrhage, unspecified: Secondary | ICD-10-CM

## 2018-08-27 DIAGNOSIS — E1169 Type 2 diabetes mellitus with other specified complication: Secondary | ICD-10-CM | POA: Diagnosis not present

## 2018-08-27 DIAGNOSIS — A4151 Sepsis due to Escherichia coli [E. coli]: Principal | ICD-10-CM

## 2018-08-27 DIAGNOSIS — J449 Chronic obstructive pulmonary disease, unspecified: Secondary | ICD-10-CM

## 2018-08-27 DIAGNOSIS — E118 Type 2 diabetes mellitus with unspecified complications: Secondary | ICD-10-CM | POA: Diagnosis not present

## 2018-08-27 DIAGNOSIS — E1159 Type 2 diabetes mellitus with other circulatory complications: Secondary | ICD-10-CM | POA: Diagnosis not present

## 2018-08-27 MED ORDER — BETHANECHOL CHLORIDE 25 MG PO TABS: 25.0000 mg | ORAL_TABLET | Freq: Three times a day (TID) | ORAL | 0 refills | Status: AC

## 2018-08-27 MED ORDER — FINASTERIDE 5 MG PO TABS: 5.0000 mg | ORAL_TABLET | Freq: Every day | ORAL | 0 refills | Status: DC

## 2018-08-27 MED ORDER — BETHANECHOL CHLORIDE 25 MG PO TABS: 25.0000 mg | ORAL_TABLET | Freq: Three times a day (TID) | ORAL | 0 refills | Status: DC

## 2018-08-27 MED ORDER — FINASTERIDE 5 MG PO TABS: 5.0000 mg | ORAL_TABLET | Freq: Every day | ORAL | 0 refills | Status: AC

## 2018-08-27 NOTE — Discharge Instructions (Signed)

## 2018-08-27 NOTE — Discharge Summary (Addendum)
Physician Discharge Summary  Luis Bryan EZM:[id number] DOB: [birth year]  PCP: Maryella Shivers, MD  Admit date: 08/19/2018 Discharge date: 08/27/2018  Recommendations for Outpatient Follow-up:  1. Dr. Maryella Shivers, PCP in 1 week with repeat labs (CBC & BMP). 2. Dr. Jaci Standard, Orthopedics: Patient advised to keep the prior appointment that he has for 08/30/2018.  Postop follow-up. 3. Dr. Kyra Leyland, GI: Patient advised to call for appointment. 4. Recommend repeating urine microscopy in a couple of weeks to ensure resolution of microscopic hematuria which was likely related to recent CAUTI.  Home Health: None Equipment/Devices: None  Discharge Condition: Improved and stable. CODE STATUS: Full Diet recommendation: Heart healthy diet.  Discharge Diagnoses:  Active Problems:   COPD GOLD 0    Morbid obesity (HCC)   GI bleed   PNA (pneumonia)   Acute lower UTI   E coli bacteremia   Sepsis due to Escherichia coli (E. coli) (HCC)   Brief Summary: 67 year old male patient with PMH of HTN, BPH, remote provoked PE, OSA not on CPAP or home oxygen, COPD, stage III chronic kidney disease, right TKR by Dr. Andee Poles on 08/01/2018 in [place], complicated by acute urinary retention requiring Foley catheter, subsequently followed up with his Urologist and catheter was removed, presented to [facility] on 08/18/2018 due to dysuria, fever and chills.  Evaluation at [facility] consistent with sepsis due to UTI and left lingular pneumonia.  Patient was empirically started on levofloxacin at [facility].  On 08/19/2018, he developed bright red bleeding per rectum, small amounts.  He reported history of intermittent GI bleed for the last 10 years.  He has had evaluation in the past with endoscopy and colonoscopy unrevealing for source of bleeding.  He reports having months without experiencing any GI bleeding.  Since his knee surgery on 08/01/2018 he had 3 episodes of  bloody stool.  He has been on long-term Plavix but was started on aspirin 325 mg daily for 30 days for postop DVT prophylaxis after his knee surgery.  He also reported black stools after his surgery.  He was transferred to [facility] for further evaluation and management of his sepsis/bacteremia and rectal bleeding.  GI was consulted.  Patient was admitted with sepsis secondary to E. coli bacteremia secondary to urinary tract infection from Foley catheter. Patient blood culture grew E. coli with intermediate resistance to Ceftriaxone. Urine culture also grew E. coli but with good sensitivity to ceftriaxone. Due to difference in sensitivity and after discussion with infectious disease CT abdomen was ordered to rule out other source of E. coli bacteremia. CT abdomen suggested cystitis. Infectious disease recommended a total of 7 days of antibiotics, from last negative blood cultures. E. coli was resistant to all oral antibiotic options.  Patient completed 7 days course of IV cefepime in the hospital.  The GI bleed remained stable.  He has decided not to undergo an endoscopy evaluation during this hospitalization.  Plavix resumed.    Assessment & Plan:  Sepsis due to E coli Bacteremia from CAUTI - He presented with hypotension, leukocytosis, at prior facility.  - He was initially treated with levaquin started 11-21. - He was then changed to IV Ceftriaxone.  - Blood cultures from [place]:  E. coli, with intermediate sensitive to Ceftriaxone. Changed antibiotics to cefepime.  - Prior Hospitalist discussed with ID on 11/25 who recommended CT abdomen to rule out different source of infection due to difference in E coli sensitivity. CT Abdomen showed findings suggestive  of cystitis. - ID recommended 7 days of IV antibiotics after last negative blood cultures. E coli was resistant to all oral options.  He completed this course on day of discharge. - Blood culture 11-24 no growth, final report.   Urine culture 11/22: Insignificant growth. -Sepsis resolved.  UTI; with hematuria.  Related to prior foley catheter.  Started on IV cefepime and completed 7 days course.  Acute GI bleed. History of same.  Presented with blood per rectum, black stool.  Received one unit PRBC 11-22,. No further bleeding noted.  Was on IV PPI here, resumed home dose of oral PPI at discharge. Hemoglobin remained stable. Patient has decide to hold off on endoscopy evaluation for now.  Plavix has been resumed and monitor for bleeding.  [facility] seen and signed off 11/27.  Outpatient follow-up with primary gastroenterologist in [place]. I discussed with his orthopedic surgeon on day of discharge who agrees with not reinitiating aspirin.  AKI on CKD stage III; per report cr one year ago was at 1.3.  Cr at  was 2.2.--1.9  Creatinine has normalized.  Acute kidney injury resolved. Periodically follow BMP as outpatient.  PNA, lingula. Started  ceftriaxone and Zithromax.  Chest x-ray 08/19/2018 showed no active cardiopulmonary disease.  No evidence of pneumonia or pulmonary edema.  Chronic bronchitic changes. Azithromycin stopped  Completed treatment.  Urinary retention/BPH; He was started on bethanechol 25 mg TID, Proscar 5 mg daily.  Outpatient follow-up with PCP/Urology.  History of stroke.   Clopidogrel resumed and has been on this for a long time PTA.  Recently started on aspirin after knee surgery on 11/4 for postop DVT prophylaxis for a month.  Aspirin discontinued d/t concern for recurrent bleeding from DAPT.   Recent Right knee sx/TKR;  Discontinued aspirin.  I discussed with Dr. Andee Poles today and updated his current hospitalization and he agrees regarding not continuing aspirin due to recent GI bleed.  Follow-up with orthopedic surgeon in [place] following discharge, patient has appointment for Tuesday of next week.  Chronic pain.   Resumed MS Contin.Controlled  Fibromyalgia   Continue with gabapentin & MS Contin.  LE edema Patient was on chronic Lasix at home which had been held in the hospital.  Resumed yesterday and edema resolved.  Essential hypertension Mildly uncontrolled at times.  Continue home dose of metoprolol and quinapril.  COPD/OSA Stable.  Normocytic anemia Possibly multifactorial.  Also had acute blood loss anemia from recent GI bleed.  Required transfusion.  Stable.    Consultants:   [facility]  Procedures:   None    Discharge Instructions  Discharge Instructions    Call MD for:  difficulty breathing, headache or visual disturbances   Complete by:  As directed    Call MD for:  extreme fatigue   Complete by:  As directed    Call MD for:  persistant dizziness or light-headedness   Complete by:  As directed    Call MD for:  persistant nausea and vomiting   Complete by:  As directed    Call MD for:  redness, tenderness, or signs of infection (pain, swelling, redness, odor or green/yellow discharge around incision site)   Complete by:  As directed    Call MD for:  severe uncontrolled pain   Complete by:  As directed    Call MD for:  temperature >100.4   Complete by:  As directed    Diet - low sodium heart healthy   Complete by:  As directed  Increase activity slowly   Complete by:  As directed        Medication List    STOP taking these medications   aspirin 325 MG EC tablet   celecoxib 200 MG capsule Commonly known as:  CELEBREX   cyclobenzaprine 10 MG tablet Commonly known as:  FLEXERIL   FLUTTER Devi   OXYGEN   sulfamethoxazole-trimethoprim 400-80 MG tablet Commonly known as:  BACTRIM,SEPTRA   tiZANidine 2 MG tablet Commonly known as:  ZANAFLEX     TAKE these medications   acetaminophen 500 MG tablet Commonly known as:  TYLENOL Take 1,000 mg by mouth every 6 (six) hours as needed for fever or headache.   bethanechol 25 MG tablet Commonly known as:  URECHOLINE Take 1 tablet (25 mg total)  by mouth 3 (three) times daily.   clopidogrel 75 MG tablet Commonly known as:  PLAVIX Take 75 mg by mouth at bedtime.   CRESTOR 10 MG tablet Generic drug:  rosuvastatin Take 10 mg by mouth at bedtime.   esomeprazole 40 MG capsule Commonly known as:  NEXIUM Take 40 mg by mouth every morning.   fenofibrate 160 MG tablet Take 160 mg by mouth at bedtime.   finasteride 5 MG tablet Commonly known as:  PROSCAR Take 1 tablet (5 mg total) by mouth daily.   furosemide 20 MG tablet Commonly known as:  LASIX Take 20 mg by mouth every morning.   gabapentin 600 MG tablet Commonly known as:  NEURONTIN Take 1,200 mg by mouth 3 (three) times daily.   iron polysaccharides 150 MG capsule Commonly known as:  NIFEREX Take 150 mg by mouth every morning.   metoprolol tartrate 25 MG tablet Commonly known as:  LOPRESSOR Take 25 mg by mouth at bedtime.   morphine 15 MG 12 hr tablet Commonly known as:  MS CONTIN Take 15 mg by mouth 3 (three) times daily.   primidone 50 MG tablet Commonly known as:  MYSOLINE Take 200 mg by mouth at bedtime.   quinapril 5 MG tablet Commonly known as:  ACCUPRIL Take 5 mg by mouth every morning.      Follow-up Information    Maryella Shivers, MD. Schedule an appointment as soon as possible for a visit in 1 week(s).   Specialty:  Family Medicine Why:  To be seen with repeat labs (CBC & BMP). Contact information: [address] [place] [address] [phone]        Jaci Standard, MD Follow up on 08/30/2018.   Specialty:  Orthopedic Surgery Why:  Patient advised to keep the prior appointment that he has. Contact information: [place] [address] [phone]        Misenheimer, Christia Reading, MD. Schedule an appointment as soon as possible for a visit.   Specialty:  Unknown Physician Specialty Contact information: [place] [address] [phone]          Allergies  Allergen  Reactions  . Levetiracetam Itching and Anxiety  . Cymbalta [Duloxetine Hcl]   . Lyrica [Pregabalin]   . Tamsulosin Itching    Dizziness Dizziness       Procedures/Studies: Ct Abdomen Pelvis Wo Contrast  Result Date: 08/22/2018 CLINICAL DATA:  Sepsis. EXAM: CT ABDOMEN AND PELVIS WITHOUT CONTRAST TECHNIQUE: Multidetector CT imaging of the abdomen and pelvis was performed following the standard protocol without IV contrast. COMPARISON:  None. FINDINGS: Lower chest: No acute abnormality. Hepatobiliary: Status post cholecystectomy. No biliary dilatation is noted. Probable cyst  seen in posterior segment of right hepatic lobe. Pancreas: Unremarkable. No pancreatic ductal dilatation or surrounding inflammatory changes. Spleen: Normal in size without focal abnormality. Adrenals/Urinary Tract: Adrenal glands and kidneys are unremarkable. No hydronephrosis or renal obstruction is noted. No renal or ureteral calculi are noted. Mild wall thickening with minimal surrounding inflammatory changes is seen involving the urinary bladder suggesting cystitis. Stomach/Bowel: Stomach is within normal limits. Appendix appears normal. No evidence of bowel wall thickening, distention, or inflammatory changes. Vascular/Lymphatic: Aortic atherosclerosis. No enlarged abdominal or pelvic lymph nodes. Reproductive: Moderate prostatic enlargement is noted. Other: No abdominal wall hernia or abnormality. No abdominopelvic ascites. Musculoskeletal: No acute or significant osseous findings. IMPRESSION: Mild urinary bladder wall thickening is noted with minimal surrounding inflammatory changes consistent with cystitis. Clinical correlation is recommended. Moderate prostatic enlargement is noted. Aortic Atherosclerosis (ICD10-I70.0). Electronically Signed   By: Marijo Conception, M.D.   On: 08/22/2018 13:49   Dg Chest 2 View  Result Date: 08/19/2018 CLINICAL DATA:  Cough cough. History of COPD. EXAM: CHEST - 2 VIEW COMPARISON:  None.  FINDINGS: Heart size and mediastinal contours are within normal limits. Chronic bronchitic changes noted centrally. Lungs otherwise clear. No confluent opacity to suggest a developing pneumonia. No pleural effusion or pneumothorax seen. Osseous structures about the chest are unremarkable. IMPRESSION: 1. No active cardiopulmonary disease. No evidence of pneumonia or pulmonary edema. 2. Chronic bronchitic changes. Electronically Signed   By: Franki Cabot M.D.   On: 08/19/2018 22:23      Subjective: Patient denies complaints.  No pain, fever or chills reported.  Urinating without difficulty.  Ambulating without difficulty.  Tolerating diet and having BMs.  Discharge Exam:  Vitals:   08/26/18 1500 08/26/18 2121 08/27/18 0436 08/27/18 1413  BP:  (!) 135/91 (!) 138/91 121/76  Pulse:  68 66 72  Resp:  (!) 22 14 20   Temp: 98 F (36.7 C) 99 F (37.2 C) 98.3 F (36.8 C) 98.5 F (36.9 C)  TempSrc: Oral Oral Oral Oral  SpO2:  99% 97% 95%  Weight:      Height:        General exam: Pleasant middle-aged male, moderately built and obese lying comfortably propped up in bed. Respiratory system: Clear to auscultation.  No increased work of breathing. Cardiovascular system: S 1, S 2 RRR.  No JVD, murmurs. No ankle edema. Gastrointestinal system: Abdomen is nondistended, soft and nontender.  Normal bowel sounds heard. Central nervous system: Alert and oriented x3.  No focal neurological deficits. Extremities: Symmetric 5 x 5 power. Right knee no edema, no redness, no worsening pain, incision healing.  Skin: No rash     The results of significant diagnostics from this hospitalization (including imaging, microbiology, ancillary and laboratory) are listed below for reference.     Microbiology: Recent Results (from the past 240 hour(s))  MRSA PCR Screening     Status: None   Collection Time: 08/19/18  6:21 PM  Result Value Ref Range Status   MRSA by PCR NEGATIVE NEGATIVE Final    Comment:         The GeneXpert MRSA Assay (FDA approved for NASAL specimens only), is one component of a comprehensive MRSA colonization surveillance program. It is not intended to diagnose MRSA infection nor to guide or monitor treatment for MRSA infections. Performed at [facility], [place] [address], [place], [place] [address]   Urine Culture     Status: Abnormal   Collection Time: 08/19/18  6:22 PM  Result Value Ref Range Status   Specimen Description   Final    URINE, CLEAN CATCH Performed at [facility], [place] [address] [place], [place] [address]    Special Requests   Final    NONE Performed at [facility], [place] [address], [place], [place] [address]    Culture (A)  Final    <10,000 COLONIES/mL INSIGNIFICANT GROWTH Performed at [facility] [address] [place], [place] [address]    Report Status 08/21/2018 FINAL  Final  Culture, blood (routine x 2)     Status: None   Collection Time: 08/21/18  7:29 AM  Result Value Ref Range Status   Specimen Description   Final    BLOOD RIGHT ANTECUBITAL Performed at [facility] [address] [place], [place] [address]    Special Requests   Final    BOTTLES DRAWN AEROBIC ONLY Blood Culture adequate volume Performed at [facility] [address] [place], [place] [address]    Culture   Final    NO GROWTH 5 DAYS Performed at [facility], [place] [address], [place], [place] [address]    Report Status 08/26/2018 FINAL  Final  Culture, blood (routine x 2)     Status: None   Collection Time: 08/21/18  7:30 AM  Result Value Ref Range Status   Specimen Description   Final    BLOOD LEFT HAND Performed at [facility] [address] [place], [place] [address]    Special Requests   Final    BOTTLES DRAWN AEROBIC ONLY Blood Culture adequate volume Performed at [facility] [address]  [place], [place] [address]    Culture   Final    NO GROWTH 5 DAYS Performed at [facility], [place] [address] [place], [place] [address]    Report Status 08/26/2018 FINAL  Final     Labs: CBC: Recent Labs  Lab 08/21/18 0729 08/22/18 0415 08/23/18 0738 08/24/18 0629  WBC 5.1 4.6 5.4 6.2  HGB 8.6* 8.2* 9.1* 9.0*  HCT 28.3* 26.9* 29.1* 29.2*  MCV 92.5 94.7 90.1 92.4  PLT 233 199 235 630   Basic Metabolic Panel: Recent Labs  Lab 08/21/18 0729 08/22/18 0415 08/24/18 0629 08/26/18 0650  NA 142 142 142 141  K 3.8 3.5 3.5 3.5  CL 112* 113* 109 106  CO2 24 21* 26 29  GLUCOSE 106* 95 109* 112*  BUN 14 12 10 10   CREATININE 0.92 0.84 0.77 0.78  CALCIUM 8.6* 8.4* 8.7* 8.7*   Urinalysis    Component Value Date/Time   COLORURINE YELLOW 08/19/2018 1822   APPEARANCEUR HAZY (A) 08/19/2018 1822   LABSPEC 1.020 08/19/2018 1822   PHURINE 5.0 08/19/2018 1822   GLUCOSEU NEGATIVE 08/19/2018 1822   HGBUR MODERATE (A) 08/19/2018 1822   BILIRUBINUR NEGATIVE 08/19/2018 1822   KETONESUR NEGATIVE 08/19/2018 1822   PROTEINUR 30 (A) 08/19/2018 1822   NITRITE NEGATIVE 08/19/2018 1822   LEUKOCYTESUR LARGE (A) 08/19/2018 1822      Time coordinating discharge: 60 minutes  SIGNED:  Vernell Leep, MD, FACP, [facility]. [facility] Pager [phone]  If 7PM-7AM, please contact night-coverage [email or web address] Password [email or web address] 08/27/2018, 3:13 PM

## 2018-08-31 DIAGNOSIS — K922 Gastrointestinal hemorrhage, unspecified: Secondary | ICD-10-CM | POA: Diagnosis not present

## 2018-08-31 DIAGNOSIS — N179 Acute kidney failure, unspecified: Secondary | ICD-10-CM | POA: Diagnosis not present

## 2018-08-31 DIAGNOSIS — J159 Unspecified bacterial pneumonia: Secondary | ICD-10-CM | POA: Diagnosis not present

## 2018-08-31 DIAGNOSIS — R339 Retention of urine, unspecified: Secondary | ICD-10-CM | POA: Diagnosis not present

## 2018-08-31 DIAGNOSIS — E782 Mixed hyperlipidemia: Secondary | ICD-10-CM | POA: Diagnosis not present

## 2018-08-31 DIAGNOSIS — R7303 Prediabetes: Secondary | ICD-10-CM | POA: Diagnosis not present

## 2018-08-31 DIAGNOSIS — I129 Hypertensive chronic kidney disease with stage 1 through stage 4 chronic kidney disease, or unspecified chronic kidney disease: Secondary | ICD-10-CM | POA: Diagnosis not present

## 2018-09-07 DIAGNOSIS — J439 Emphysema, unspecified: Secondary | ICD-10-CM | POA: Diagnosis not present

## 2018-09-07 DIAGNOSIS — Z7189 Other specified counseling: Secondary | ICD-10-CM | POA: Diagnosis not present

## 2018-09-07 DIAGNOSIS — N182 Chronic kidney disease, stage 2 (mild): Secondary | ICD-10-CM | POA: Diagnosis not present

## 2018-09-07 DIAGNOSIS — I129 Hypertensive chronic kidney disease with stage 1 through stage 4 chronic kidney disease, or unspecified chronic kidney disease: Secondary | ICD-10-CM | POA: Diagnosis not present

## 2018-09-07 DIAGNOSIS — E1121 Type 2 diabetes mellitus with diabetic nephropathy: Secondary | ICD-10-CM | POA: Diagnosis not present

## 2018-09-27 DIAGNOSIS — E1121 Type 2 diabetes mellitus with diabetic nephropathy: Secondary | ICD-10-CM | POA: Diagnosis not present

## 2018-09-27 DIAGNOSIS — N182 Chronic kidney disease, stage 2 (mild): Secondary | ICD-10-CM | POA: Diagnosis not present

## 2018-09-27 DIAGNOSIS — J439 Emphysema, unspecified: Secondary | ICD-10-CM | POA: Diagnosis not present

## 2018-09-27 DIAGNOSIS — I129 Hypertensive chronic kidney disease with stage 1 through stage 4 chronic kidney disease, or unspecified chronic kidney disease: Secondary | ICD-10-CM | POA: Diagnosis not present

## 2018-10-12 DIAGNOSIS — Z96651 Presence of right artificial knee joint: Secondary | ICD-10-CM | POA: Diagnosis not present

## 2018-10-25 DIAGNOSIS — M159 Polyosteoarthritis, unspecified: Secondary | ICD-10-CM | POA: Diagnosis not present

## 2018-10-25 DIAGNOSIS — G894 Chronic pain syndrome: Secondary | ICD-10-CM | POA: Diagnosis not present

## 2018-10-25 DIAGNOSIS — G890 Central pain syndrome: Secondary | ICD-10-CM | POA: Diagnosis not present

## 2018-10-25 DIAGNOSIS — M797 Fibromyalgia: Secondary | ICD-10-CM | POA: Diagnosis not present

## 2018-10-25 DIAGNOSIS — Z79891 Long term (current) use of opiate analgesic: Secondary | ICD-10-CM | POA: Diagnosis not present

## 2018-10-25 DIAGNOSIS — G629 Polyneuropathy, unspecified: Secondary | ICD-10-CM | POA: Diagnosis not present

## 2018-10-25 DIAGNOSIS — M1990 Unspecified osteoarthritis, unspecified site: Secondary | ICD-10-CM | POA: Diagnosis not present

## 2018-10-28 DIAGNOSIS — E1121 Type 2 diabetes mellitus with diabetic nephropathy: Secondary | ICD-10-CM | POA: Diagnosis not present

## 2018-10-28 DIAGNOSIS — J439 Emphysema, unspecified: Secondary | ICD-10-CM | POA: Diagnosis not present

## 2018-10-28 DIAGNOSIS — I129 Hypertensive chronic kidney disease with stage 1 through stage 4 chronic kidney disease, or unspecified chronic kidney disease: Secondary | ICD-10-CM | POA: Diagnosis not present

## 2018-10-28 DIAGNOSIS — N182 Chronic kidney disease, stage 2 (mild): Secondary | ICD-10-CM | POA: Diagnosis not present

## 2018-11-09 DIAGNOSIS — Z96651 Presence of right artificial knee joint: Secondary | ICD-10-CM | POA: Diagnosis not present

## 2018-11-25 DIAGNOSIS — I129 Hypertensive chronic kidney disease with stage 1 through stage 4 chronic kidney disease, or unspecified chronic kidney disease: Secondary | ICD-10-CM | POA: Diagnosis not present

## 2018-11-25 DIAGNOSIS — E1121 Type 2 diabetes mellitus with diabetic nephropathy: Secondary | ICD-10-CM | POA: Diagnosis not present

## 2018-11-25 DIAGNOSIS — J439 Emphysema, unspecified: Secondary | ICD-10-CM | POA: Diagnosis not present

## 2018-11-25 DIAGNOSIS — N182 Chronic kidney disease, stage 2 (mild): Secondary | ICD-10-CM | POA: Diagnosis not present

## 2018-12-27 DIAGNOSIS — N182 Chronic kidney disease, stage 2 (mild): Secondary | ICD-10-CM | POA: Diagnosis not present

## 2018-12-27 DIAGNOSIS — J439 Emphysema, unspecified: Secondary | ICD-10-CM | POA: Diagnosis not present

## 2018-12-27 DIAGNOSIS — I129 Hypertensive chronic kidney disease with stage 1 through stage 4 chronic kidney disease, or unspecified chronic kidney disease: Secondary | ICD-10-CM | POA: Diagnosis not present

## 2018-12-27 DIAGNOSIS — E1121 Type 2 diabetes mellitus with diabetic nephropathy: Secondary | ICD-10-CM | POA: Diagnosis not present

## 2019-01-02 DIAGNOSIS — I129 Hypertensive chronic kidney disease with stage 1 through stage 4 chronic kidney disease, or unspecified chronic kidney disease: Secondary | ICD-10-CM | POA: Diagnosis not present

## 2019-01-02 DIAGNOSIS — E1169 Type 2 diabetes mellitus with other specified complication: Secondary | ICD-10-CM | POA: Diagnosis not present

## 2019-01-09 DIAGNOSIS — E1169 Type 2 diabetes mellitus with other specified complication: Secondary | ICD-10-CM | POA: Diagnosis not present

## 2019-01-09 DIAGNOSIS — E1121 Type 2 diabetes mellitus with diabetic nephropathy: Secondary | ICD-10-CM | POA: Diagnosis not present

## 2019-01-09 DIAGNOSIS — E785 Hyperlipidemia, unspecified: Secondary | ICD-10-CM | POA: Diagnosis not present

## 2019-01-09 DIAGNOSIS — I129 Hypertensive chronic kidney disease with stage 1 through stage 4 chronic kidney disease, or unspecified chronic kidney disease: Secondary | ICD-10-CM | POA: Diagnosis not present

## 2019-01-24 DIAGNOSIS — M159 Polyosteoarthritis, unspecified: Secondary | ICD-10-CM | POA: Diagnosis not present

## 2019-01-24 DIAGNOSIS — M797 Fibromyalgia: Secondary | ICD-10-CM | POA: Diagnosis not present

## 2019-01-24 DIAGNOSIS — G890 Central pain syndrome: Secondary | ICD-10-CM | POA: Diagnosis not present

## 2019-01-24 DIAGNOSIS — G894 Chronic pain syndrome: Secondary | ICD-10-CM | POA: Diagnosis not present

## 2019-01-26 DIAGNOSIS — E1169 Type 2 diabetes mellitus with other specified complication: Secondary | ICD-10-CM | POA: Diagnosis not present

## 2019-01-26 DIAGNOSIS — E785 Hyperlipidemia, unspecified: Secondary | ICD-10-CM | POA: Diagnosis not present

## 2019-01-26 DIAGNOSIS — E1121 Type 2 diabetes mellitus with diabetic nephropathy: Secondary | ICD-10-CM | POA: Diagnosis not present

## 2019-01-26 DIAGNOSIS — I129 Hypertensive chronic kidney disease with stage 1 through stage 4 chronic kidney disease, or unspecified chronic kidney disease: Secondary | ICD-10-CM | POA: Diagnosis not present

## 2019-01-27 DIAGNOSIS — J441 Chronic obstructive pulmonary disease with (acute) exacerbation: Secondary | ICD-10-CM | POA: Diagnosis not present

## 2019-02-01 DIAGNOSIS — J439 Emphysema, unspecified: Secondary | ICD-10-CM | POA: Diagnosis not present

## 2019-02-01 DIAGNOSIS — Z0000 Encounter for general adult medical examination without abnormal findings: Secondary | ICD-10-CM | POA: Diagnosis not present

## 2019-02-01 DIAGNOSIS — Z139 Encounter for screening, unspecified: Secondary | ICD-10-CM | POA: Diagnosis not present

## 2019-02-24 DIAGNOSIS — J439 Emphysema, unspecified: Secondary | ICD-10-CM | POA: Diagnosis not present

## 2019-02-24 DIAGNOSIS — E1169 Type 2 diabetes mellitus with other specified complication: Secondary | ICD-10-CM | POA: Diagnosis not present

## 2019-02-24 DIAGNOSIS — E1121 Type 2 diabetes mellitus with diabetic nephropathy: Secondary | ICD-10-CM | POA: Diagnosis not present

## 2019-02-24 DIAGNOSIS — J441 Chronic obstructive pulmonary disease with (acute) exacerbation: Secondary | ICD-10-CM | POA: Diagnosis not present

## 2019-03-21 ENCOUNTER — Other Ambulatory Visit: Payer: Self-pay

## 2019-03-21 NOTE — Patient Outreach (Signed)
[facility]) Care Management  03/21/2019  Luis Bryan 1950-10-07 [id number]   Medication Adherence call to Mr. Luis Bryan HIPPA Compliant Voice message left with a call back number. Mr. Luis Bryan is showing past due on Rosuvastatin 10 mg under [place].   [facility] Direct Dial [phone]  Fax [phone] [email or web address]

## 2019-03-28 DIAGNOSIS — E1169 Type 2 diabetes mellitus with other specified complication: Secondary | ICD-10-CM | POA: Diagnosis not present

## 2019-03-28 DIAGNOSIS — E1121 Type 2 diabetes mellitus with diabetic nephropathy: Secondary | ICD-10-CM | POA: Diagnosis not present

## 2019-03-28 DIAGNOSIS — J439 Emphysema, unspecified: Secondary | ICD-10-CM | POA: Diagnosis not present

## 2019-03-28 DIAGNOSIS — J441 Chronic obstructive pulmonary disease with (acute) exacerbation: Secondary | ICD-10-CM | POA: Diagnosis not present

## 2019-04-03 IMAGING — DX DG CHEST 2V
2 series · 2 of 2 positions shown · non-contrast
Comparison: None.

CLINICAL DATA: Cough cough. History of COPD.

EXAM:
CHEST - 2 VIEW

[chest pa]
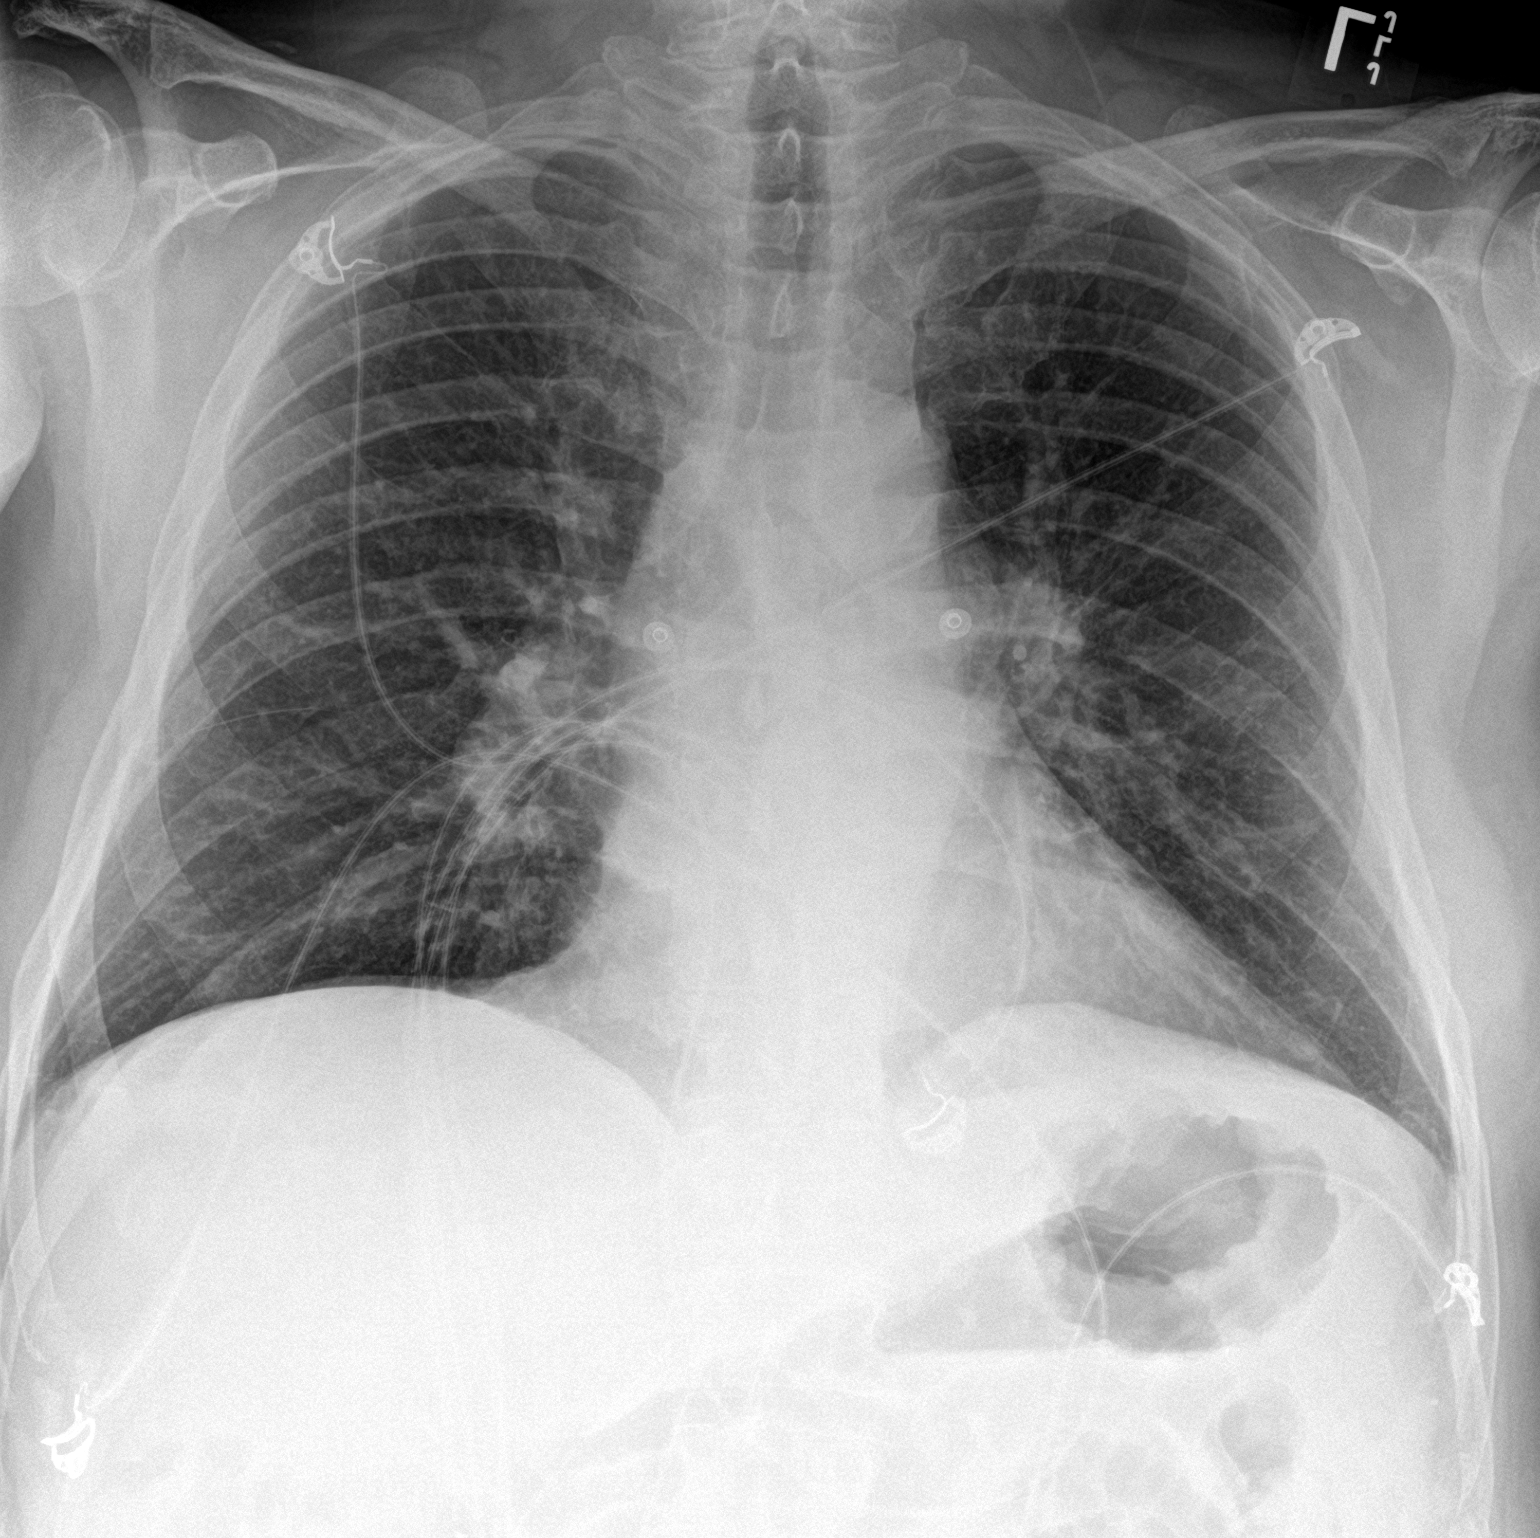

[chest lat]
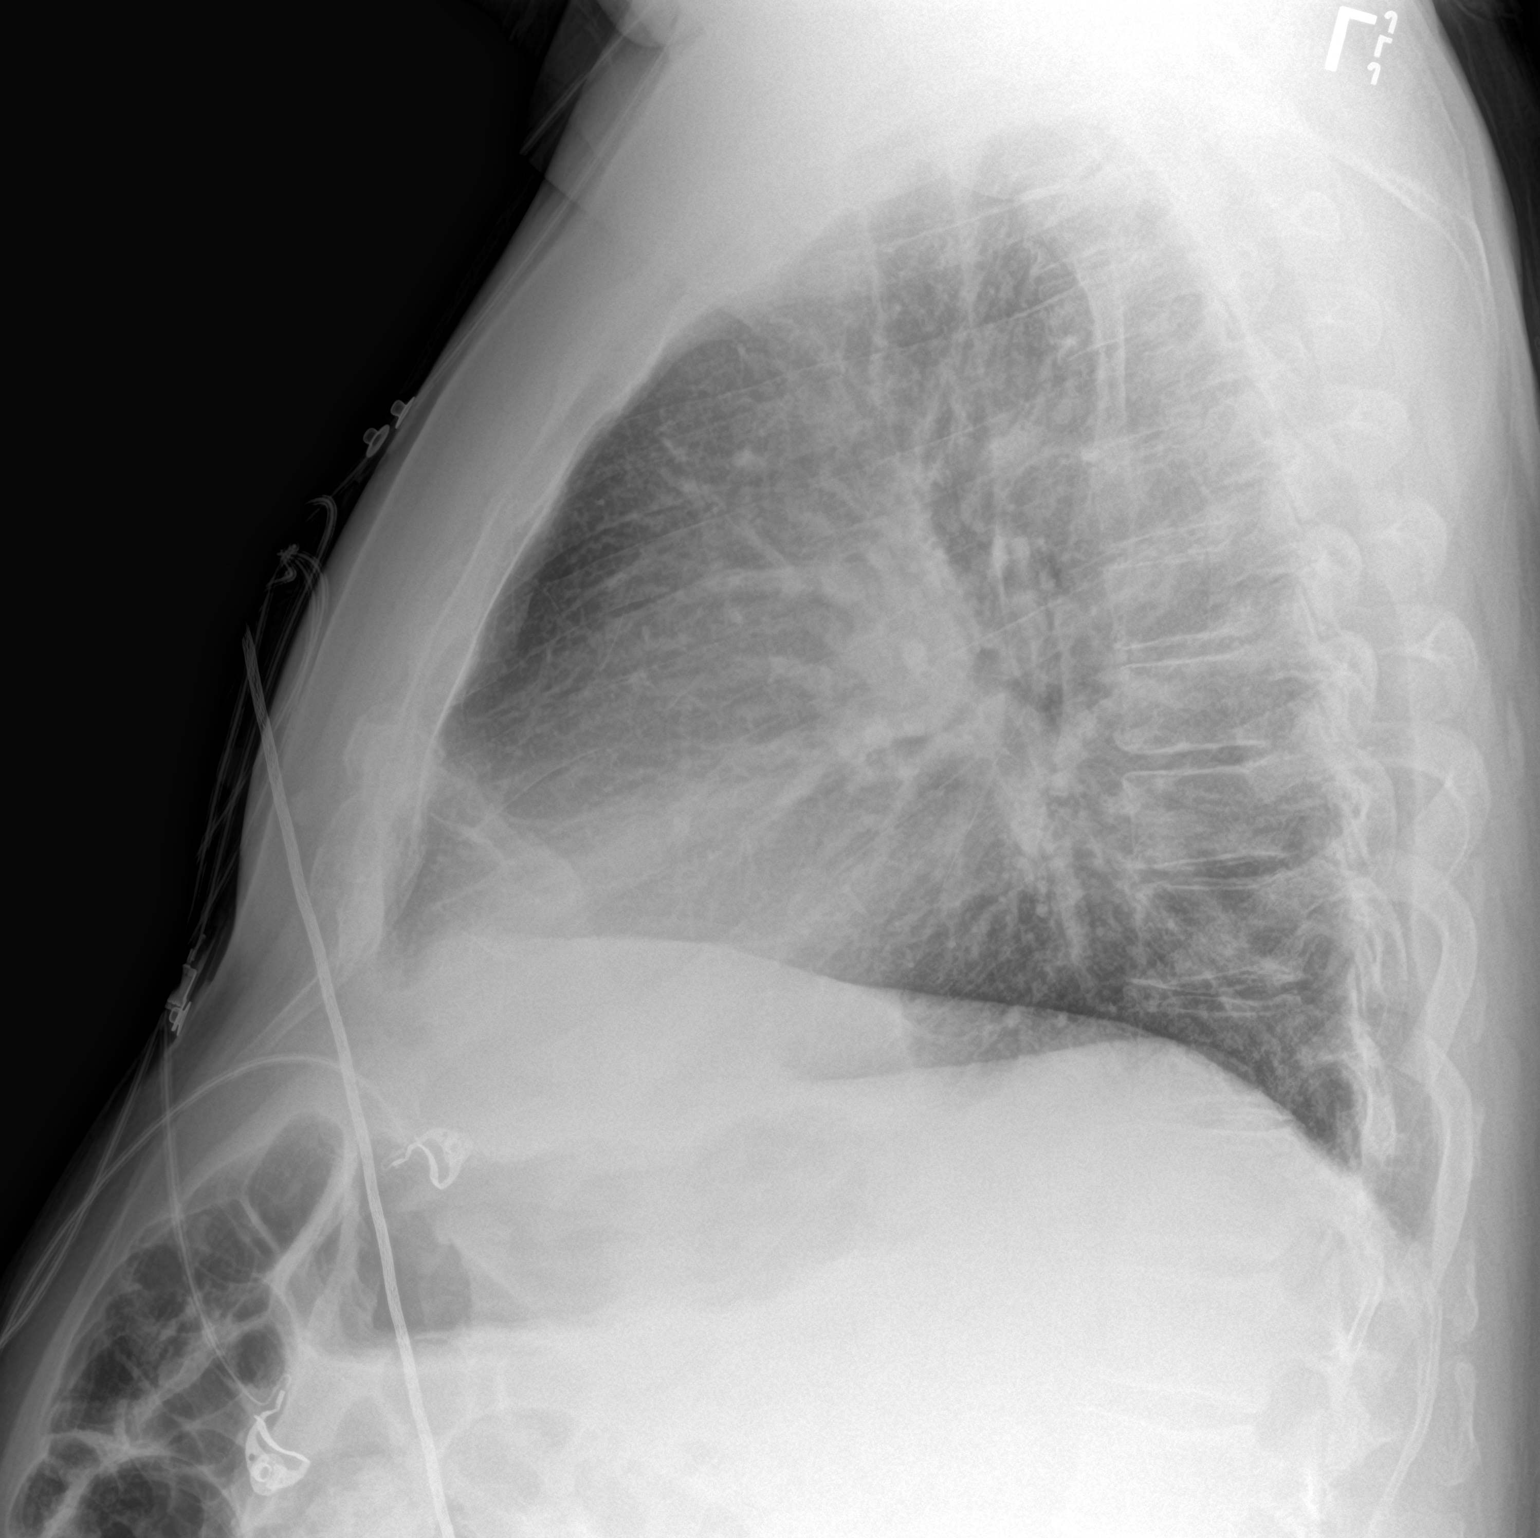

[2 of 2 positions shown; findings below may reference images not displayed]

FINDINGS: Heart size and mediastinal contours are within normal limits.
Chronic bronchitic changes noted centrally. Lungs otherwise clear.
No confluent opacity to suggest a developing pneumonia. No pleural
effusion or pneumothorax seen. Osseous structures about the chest
are unremarkable.
IMPRESSION: 1. No active cardiopulmonary disease. No evidence of pneumonia or
pulmonary edema.
2. Chronic bronchitic changes.

## 2019-04-06 IMAGING — CT CT ABD-PELV W/O CM
2 of 4 series · 17 of 46 positions shown, 19 images · non-contrast
Comparison: None.

CLINICAL DATA: Sepsis.

EXAM:
CT ABDOMEN AND PELVIS WITHOUT CONTRAST
TECHNIQUE: Multidetector CT imaging of the abdomen and pelvis was performed
following the standard protocol without IV contrast.

[Series 2: axial st · axial · 0.98mm/px · z∈[-763,-328]mm · 14 of 99 slices shown, 16 images]
[im 6/99  soft-tissue]
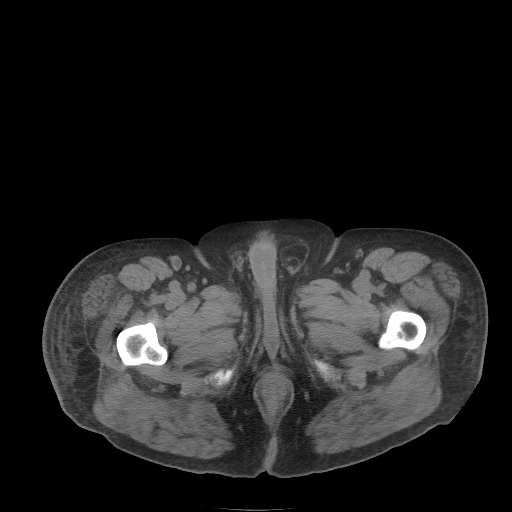
[im 6/99  bone]
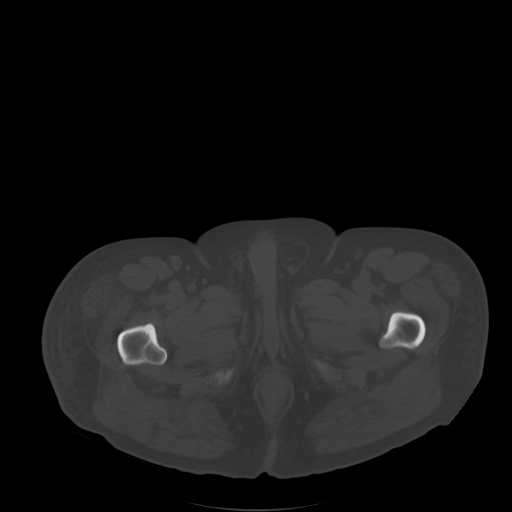
[im 11/99  soft-tissue]
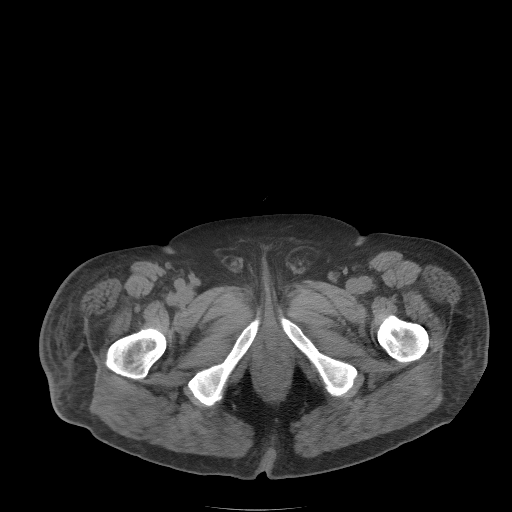
[im 22/99  soft-tissue]
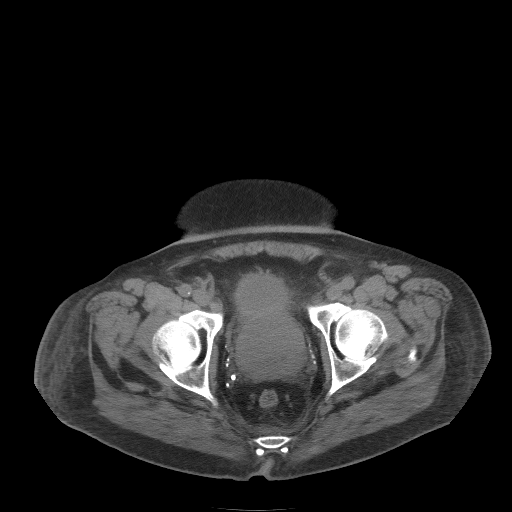
[im 28/99  soft-tissue]
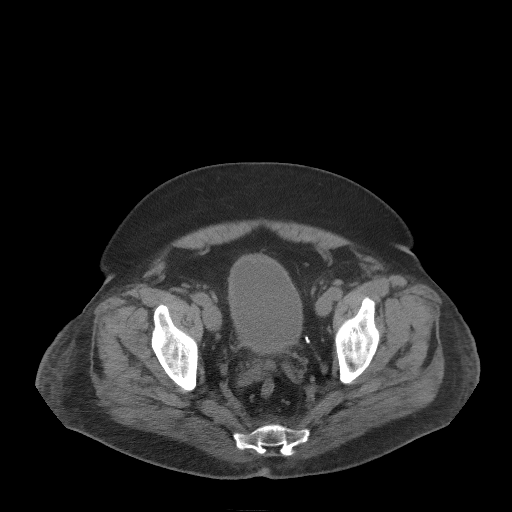
[im 33/99  soft-tissue]
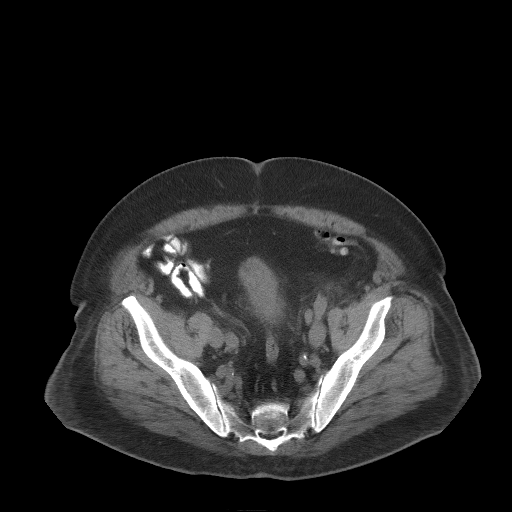
[im 39/99  soft-tissue]
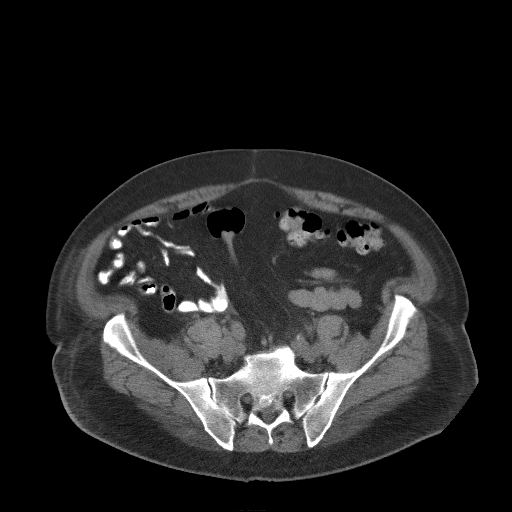
[im 44/99  soft-tissue]
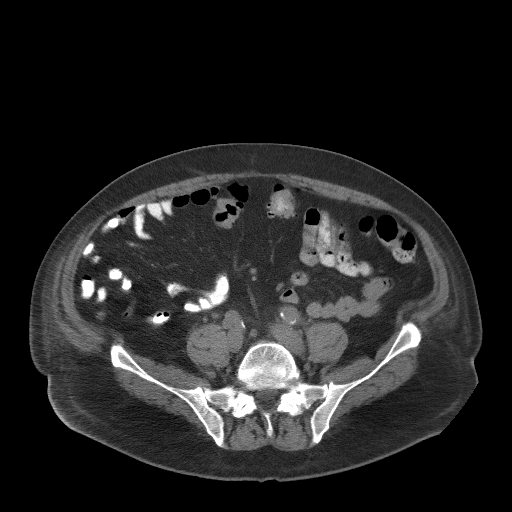
[im 55/99  soft-tissue]
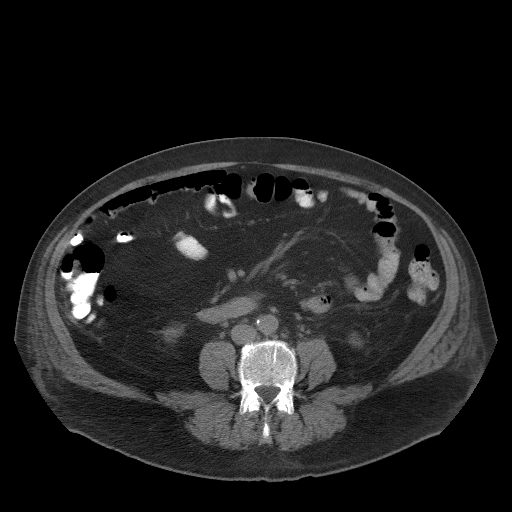
[im 60/99  soft-tissue]
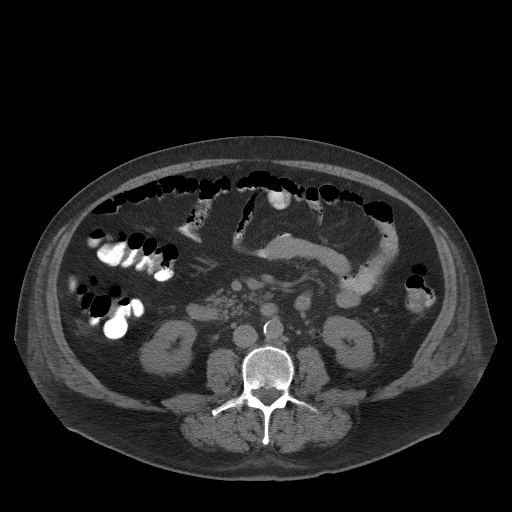
[im 60/99  bone]
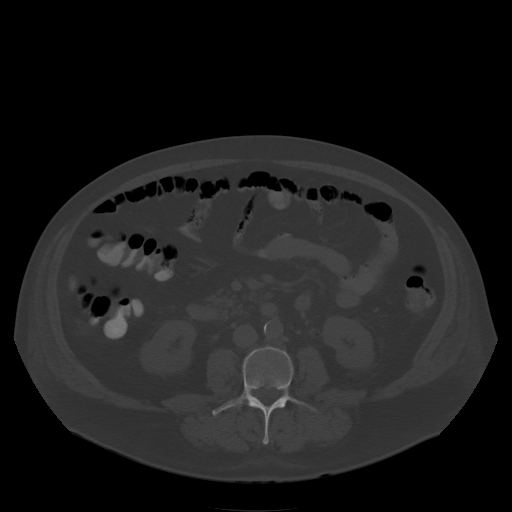
[im 66/99  soft-tissue]
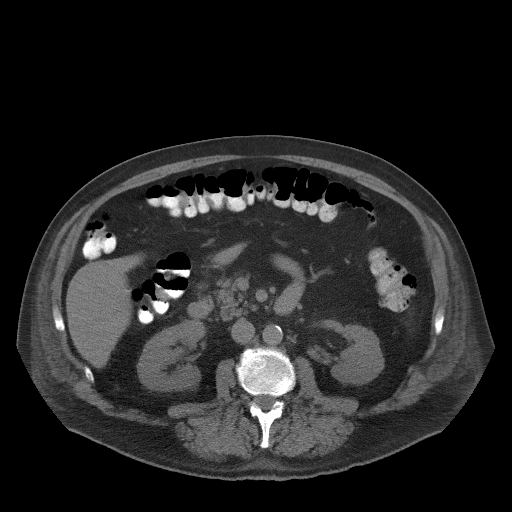
[im 71/99  soft-tissue]
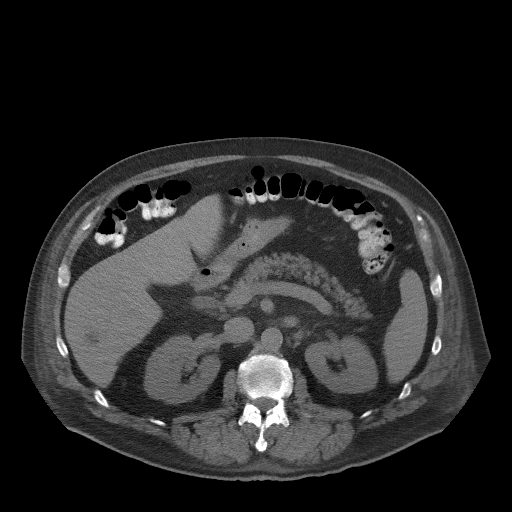
[im 77/99  soft-tissue]
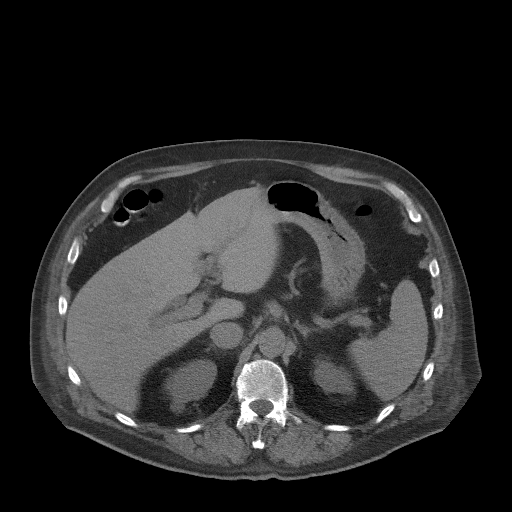
[im 88/99  soft-tissue]
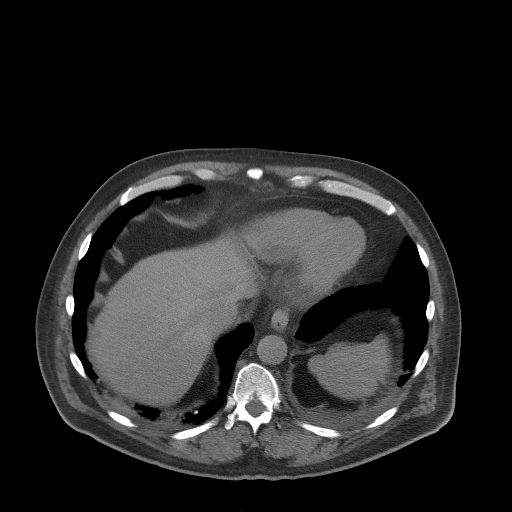
[im 93/99  soft-tissue]
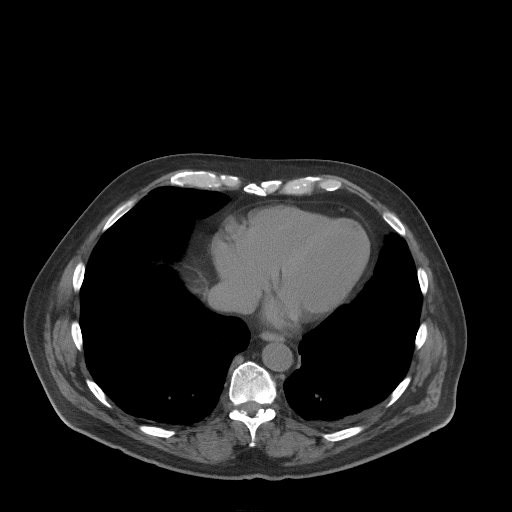

[Series 4: coronal st · coronal · 1.10mm/px · 3 of 107 slices shown]
[im 36/107  soft-tissue]
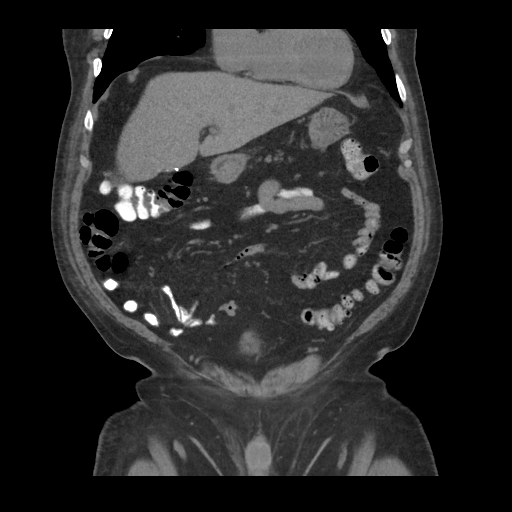
[im 48/107  soft-tissue]
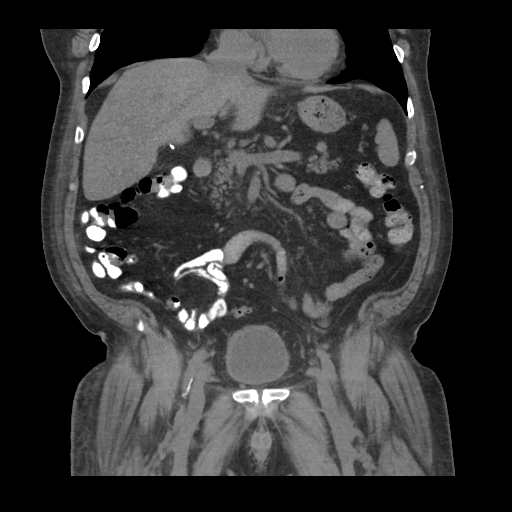
[im 59/107  soft-tissue]
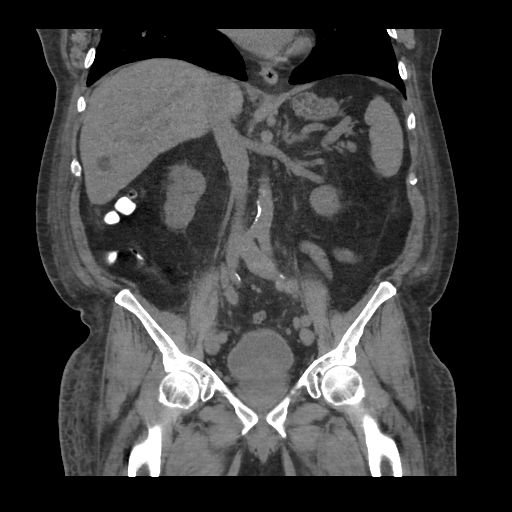

[17 of 46 positions shown; findings below may reference images not displayed]

FINDINGS: Lower chest: No acute abnormality.

Hepatobiliary: Status post cholecystectomy. No biliary dilatation is
noted. Probable cyst seen in posterior segment of right hepatic
lobe.

Pancreas: Unremarkable. No pancreatic ductal dilatation or
surrounding inflammatory changes.

Spleen: Normal in size without focal abnormality.

Adrenals/Urinary Tract: Adrenal glands and kidneys are unremarkable.
No hydronephrosis or renal obstruction is noted. No renal or
ureteral calculi are noted. Mild wall thickening with minimal
surrounding inflammatory changes is seen involving the urinary
bladder suggesting cystitis.

Stomach/Bowel: Stomach is within normal limits. Appendix appears
normal. No evidence of bowel wall thickening, distention, or
inflammatory changes.

Vascular/Lymphatic: Aortic atherosclerosis. No enlarged abdominal or
pelvic lymph nodes.

Reproductive: Moderate prostatic enlargement is noted.

Other: No abdominal wall hernia or abnormality. No abdominopelvic
ascites.

Musculoskeletal: No acute or significant osseous findings.
IMPRESSION: Mild urinary bladder wall thickening is noted with minimal
surrounding inflammatory changes consistent with cystitis. Clinical
correlation is recommended.

Moderate prostatic enlargement is noted.

Aortic Atherosclerosis ([id number]).

## 2019-04-20 DIAGNOSIS — M8949 Other hypertrophic osteoarthropathy, multiple sites: Secondary | ICD-10-CM | POA: Diagnosis not present

## 2019-04-20 DIAGNOSIS — G894 Chronic pain syndrome: Secondary | ICD-10-CM | POA: Diagnosis not present

## 2019-04-20 DIAGNOSIS — M159 Polyosteoarthritis, unspecified: Secondary | ICD-10-CM | POA: Diagnosis not present

## 2019-04-20 DIAGNOSIS — G890 Central pain syndrome: Secondary | ICD-10-CM | POA: Diagnosis not present

## 2019-04-20 DIAGNOSIS — M797 Fibromyalgia: Secondary | ICD-10-CM | POA: Diagnosis not present

## 2019-04-28 DIAGNOSIS — J441 Chronic obstructive pulmonary disease with (acute) exacerbation: Secondary | ICD-10-CM | POA: Diagnosis not present

## 2019-04-28 DIAGNOSIS — E785 Hyperlipidemia, unspecified: Secondary | ICD-10-CM | POA: Diagnosis not present

## 2019-04-28 DIAGNOSIS — E1121 Type 2 diabetes mellitus with diabetic nephropathy: Secondary | ICD-10-CM | POA: Diagnosis not present

## 2019-04-28 DIAGNOSIS — E1169 Type 2 diabetes mellitus with other specified complication: Secondary | ICD-10-CM | POA: Diagnosis not present

## 2019-05-04 DIAGNOSIS — E1169 Type 2 diabetes mellitus with other specified complication: Secondary | ICD-10-CM | POA: Diagnosis not present

## 2019-05-04 DIAGNOSIS — I129 Hypertensive chronic kidney disease with stage 1 through stage 4 chronic kidney disease, or unspecified chronic kidney disease: Secondary | ICD-10-CM | POA: Diagnosis not present

## 2019-05-29 DIAGNOSIS — E1169 Type 2 diabetes mellitus with other specified complication: Secondary | ICD-10-CM | POA: Diagnosis not present

## 2019-05-29 DIAGNOSIS — E785 Hyperlipidemia, unspecified: Secondary | ICD-10-CM | POA: Diagnosis not present

## 2019-05-29 DIAGNOSIS — J441 Chronic obstructive pulmonary disease with (acute) exacerbation: Secondary | ICD-10-CM | POA: Diagnosis not present

## 2019-05-29 DIAGNOSIS — E1121 Type 2 diabetes mellitus with diabetic nephropathy: Secondary | ICD-10-CM | POA: Diagnosis not present

## 2019-06-09 DIAGNOSIS — E1169 Type 2 diabetes mellitus with other specified complication: Secondary | ICD-10-CM | POA: Diagnosis not present

## 2019-06-09 DIAGNOSIS — E785 Hyperlipidemia, unspecified: Secondary | ICD-10-CM | POA: Diagnosis not present

## 2019-06-09 DIAGNOSIS — I129 Hypertensive chronic kidney disease with stage 1 through stage 4 chronic kidney disease, or unspecified chronic kidney disease: Secondary | ICD-10-CM | POA: Diagnosis not present

## 2019-06-09 DIAGNOSIS — N182 Chronic kidney disease, stage 2 (mild): Secondary | ICD-10-CM | POA: Diagnosis not present

## 2019-06-21 ENCOUNTER — Ambulatory Visit: Payer: Medicare Other | Admitting: Diagnostic Neuroimaging

## 2019-06-28 DIAGNOSIS — N182 Chronic kidney disease, stage 2 (mild): Secondary | ICD-10-CM | POA: Diagnosis not present

## 2019-06-28 DIAGNOSIS — I129 Hypertensive chronic kidney disease with stage 1 through stage 4 chronic kidney disease, or unspecified chronic kidney disease: Secondary | ICD-10-CM | POA: Diagnosis not present

## 2019-06-28 DIAGNOSIS — E1169 Type 2 diabetes mellitus with other specified complication: Secondary | ICD-10-CM | POA: Diagnosis not present

## 2019-06-28 DIAGNOSIS — E785 Hyperlipidemia, unspecified: Secondary | ICD-10-CM | POA: Diagnosis not present

## 2021-11-07 ENCOUNTER — Telehealth: Payer: Self-pay | Admitting: Internal Medicine

## 2021-11-07 NOTE — Telephone Encounter (Signed)
Notified [facility] that Spirometry was fax and successful confirmations received. Nothing further needed at this time.
